# Patient Record
Sex: Female | Born: 1975
Health system: Southern US, Community
[De-identification: ages and names within clinical notes are randomized; demographics above are authoritative.]

## PROBLEM LIST (undated history)

## (undated) DIAGNOSIS — D649 Anemia, unspecified: Secondary | ICD-10-CM

## (undated) DIAGNOSIS — F419 Anxiety disorder, unspecified: Secondary | ICD-10-CM

## (undated) DIAGNOSIS — Z789 Other specified health status: Secondary | ICD-10-CM

## (undated) DIAGNOSIS — R002 Palpitations: Secondary | ICD-10-CM

## (undated) DIAGNOSIS — K219 Gastro-esophageal reflux disease without esophagitis: Secondary | ICD-10-CM

## (undated) HISTORY — DX: Other specified health status: Z78.9

## (undated) HISTORY — DX: Palpitations: R00.2

## (undated) HISTORY — PX: COLONOSCOPY: SHX174

## (undated) HISTORY — DX: Anemia, unspecified: D64.9

## (undated) HISTORY — DX: Anxiety disorder, unspecified: F41.9

## (undated) HISTORY — DX: Gastro-esophageal reflux disease without esophagitis: K21.9

---

## 2001-04-28 ENCOUNTER — Encounter: Payer: Self-pay | Admitting: Emergency Medicine

## 2001-04-28 ENCOUNTER — Emergency Department (HOSPITAL_COMMUNITY): Admission: EM | Admit: 2001-04-28 | Discharge: 2001-04-28 | Payer: Self-pay | Admitting: Emergency Medicine

## 2002-12-31 ENCOUNTER — Emergency Department (HOSPITAL_COMMUNITY): Admission: EM | Admit: 2002-12-31 | Discharge: 2002-12-31 | Payer: Self-pay | Admitting: Emergency Medicine

## 2004-08-26 ENCOUNTER — Inpatient Hospital Stay (HOSPITAL_COMMUNITY): Admission: AD | Admit: 2004-08-26 | Discharge: 2004-08-26 | Payer: Self-pay | Admitting: *Deleted

## 2004-09-23 HISTORY — PX: OTHER SURGICAL HISTORY: SHX169

## 2005-03-29 ENCOUNTER — Inpatient Hospital Stay (HOSPITAL_COMMUNITY): Admission: RE | Admit: 2005-03-29 | Discharge: 2005-04-03 | Payer: Self-pay | Admitting: Obstetrics and Gynecology

## 2005-03-29 ENCOUNTER — Encounter (INDEPENDENT_AMBULATORY_CARE_PROVIDER_SITE_OTHER): Payer: Self-pay | Admitting: Specialist

## 2005-12-01 ENCOUNTER — Emergency Department (HOSPITAL_COMMUNITY): Admission: EM | Admit: 2005-12-01 | Discharge: 2005-12-01 | Payer: Self-pay | Admitting: Family Medicine

## 2005-12-01 ENCOUNTER — Emergency Department (HOSPITAL_COMMUNITY): Admission: EM | Admit: 2005-12-01 | Discharge: 2005-12-01 | Payer: Self-pay | Admitting: Emergency Medicine

## 2007-03-25 ENCOUNTER — Ambulatory Visit: Payer: Self-pay | Admitting: Internal Medicine

## 2007-03-29 ENCOUNTER — Ambulatory Visit (HOSPITAL_COMMUNITY): Admission: RE | Admit: 2007-03-29 | Discharge: 2007-03-29 | Payer: Self-pay | Admitting: Internal Medicine

## 2007-04-10 ENCOUNTER — Ambulatory Visit: Payer: Self-pay | Admitting: Internal Medicine

## 2007-06-19 ENCOUNTER — Emergency Department (HOSPITAL_COMMUNITY): Admission: EM | Admit: 2007-06-19 | Discharge: 2007-06-19 | Payer: Self-pay | Admitting: Emergency Medicine

## 2007-12-07 ENCOUNTER — Telehealth: Payer: Self-pay | Admitting: *Deleted

## 2008-01-18 DIAGNOSIS — K5909 Other constipation: Secondary | ICD-10-CM | POA: Insufficient documentation

## 2008-01-18 DIAGNOSIS — J45909 Unspecified asthma, uncomplicated: Secondary | ICD-10-CM | POA: Insufficient documentation

## 2008-05-17 ENCOUNTER — Ambulatory Visit: Payer: Self-pay | Admitting: Internal Medicine

## 2008-05-17 ENCOUNTER — Encounter: Payer: Self-pay | Admitting: Internal Medicine

## 2008-05-17 DIAGNOSIS — F438 Other reactions to severe stress: Secondary | ICD-10-CM | POA: Insufficient documentation

## 2008-05-17 DIAGNOSIS — R002 Palpitations: Secondary | ICD-10-CM | POA: Insufficient documentation

## 2008-05-17 DIAGNOSIS — F4389 Other reactions to severe stress: Secondary | ICD-10-CM | POA: Insufficient documentation

## 2008-05-18 ENCOUNTER — Telehealth: Payer: Self-pay | Admitting: Licensed Clinical Social Worker

## 2008-05-18 ENCOUNTER — Telehealth: Payer: Self-pay | Admitting: *Deleted

## 2008-06-14 ENCOUNTER — Emergency Department (HOSPITAL_COMMUNITY): Admission: EM | Admit: 2008-06-14 | Discharge: 2008-06-14 | Payer: Self-pay | Admitting: Emergency Medicine

## 2008-07-25 ENCOUNTER — Encounter: Payer: Self-pay | Admitting: Licensed Clinical Social Worker

## 2008-07-25 ENCOUNTER — Ambulatory Visit: Payer: Self-pay | Admitting: Infectious Diseases

## 2008-07-25 DIAGNOSIS — F411 Generalized anxiety disorder: Secondary | ICD-10-CM | POA: Insufficient documentation

## 2008-10-19 ENCOUNTER — Encounter (INDEPENDENT_AMBULATORY_CARE_PROVIDER_SITE_OTHER): Payer: Self-pay | Admitting: *Deleted

## 2008-10-19 ENCOUNTER — Ambulatory Visit: Payer: Self-pay | Admitting: Internal Medicine

## 2008-10-19 ENCOUNTER — Encounter: Payer: Self-pay | Admitting: Internal Medicine

## 2008-10-19 DIAGNOSIS — K921 Melena: Secondary | ICD-10-CM | POA: Insufficient documentation

## 2008-10-19 LAB — CONVERTED CEMR LAB
AST: 15 units/L (ref 0–37)
Calcium: 8.8 mg/dL (ref 8.4–10.5)
Chloride: 105 meq/L (ref 96–112)
Creatinine, Ser: 0.65 mg/dL (ref 0.40–1.20)
HCT: 39.2 % (ref 36.0–46.0)
Hemoglobin: 12.8 g/dL (ref 12.0–15.0)
MCV: 95.6 fL (ref 78.0–100.0)
RBC: 4.09 M/uL (ref 3.87–5.11)
Total Protein: 7.3 g/dL (ref 6.0–8.3)
WBC: 6.3 10*3/uL (ref 4.0–10.5)
aPTT: 33 s (ref 24–37)

## 2008-11-03 ENCOUNTER — Telehealth: Payer: Self-pay | Admitting: *Deleted

## 2009-01-31 ENCOUNTER — Encounter (INDEPENDENT_AMBULATORY_CARE_PROVIDER_SITE_OTHER): Payer: Self-pay | Admitting: *Deleted

## 2009-01-31 ENCOUNTER — Ambulatory Visit: Payer: Self-pay | Admitting: Infectious Disease

## 2009-01-31 LAB — CONVERTED CEMR LAB
Cholesterol, target level: 200 mg/dL
HDL goal, serum: 40 mg/dL
LDL Goal: 160 mg/dL

## 2009-01-31 LAB — HM PAP SMEAR: HM Pap smear: NEGATIVE

## 2009-03-21 ENCOUNTER — Telehealth: Payer: Self-pay | Admitting: *Deleted

## 2009-03-30 DIAGNOSIS — K089 Disorder of teeth and supporting structures, unspecified: Secondary | ICD-10-CM | POA: Insufficient documentation

## 2009-04-19 ENCOUNTER — Telehealth: Payer: Self-pay | Admitting: *Deleted

## 2009-04-20 ENCOUNTER — Encounter (INDEPENDENT_AMBULATORY_CARE_PROVIDER_SITE_OTHER): Payer: Self-pay | Admitting: Internal Medicine

## 2009-04-20 ENCOUNTER — Emergency Department (HOSPITAL_COMMUNITY): Admission: EM | Admit: 2009-04-20 | Discharge: 2009-04-20 | Payer: Self-pay | Admitting: Emergency Medicine

## 2009-04-24 ENCOUNTER — Encounter: Payer: Self-pay | Admitting: Internal Medicine

## 2009-04-24 ENCOUNTER — Telehealth: Payer: Self-pay | Admitting: *Deleted

## 2009-04-24 ENCOUNTER — Ambulatory Visit: Payer: Self-pay | Admitting: Internal Medicine

## 2009-04-24 ENCOUNTER — Encounter (INDEPENDENT_AMBULATORY_CARE_PROVIDER_SITE_OTHER): Payer: Self-pay | Admitting: Internal Medicine

## 2009-04-24 DIAGNOSIS — R079 Chest pain, unspecified: Secondary | ICD-10-CM | POA: Insufficient documentation

## 2009-04-26 ENCOUNTER — Emergency Department (HOSPITAL_COMMUNITY): Admission: EM | Admit: 2009-04-26 | Discharge: 2009-04-26 | Payer: Self-pay | Admitting: Emergency Medicine

## 2009-05-03 ENCOUNTER — Encounter (INDEPENDENT_AMBULATORY_CARE_PROVIDER_SITE_OTHER): Payer: Self-pay | Admitting: Internal Medicine

## 2009-05-03 ENCOUNTER — Ambulatory Visit: Payer: Self-pay | Admitting: Cardiology

## 2009-05-03 ENCOUNTER — Ambulatory Visit (HOSPITAL_COMMUNITY): Admission: RE | Admit: 2009-05-03 | Discharge: 2009-05-03 | Payer: Self-pay | Admitting: Internal Medicine

## 2009-05-26 ENCOUNTER — Ambulatory Visit: Payer: Self-pay | Admitting: Infectious Diseases

## 2009-06-28 ENCOUNTER — Telehealth: Payer: Self-pay | Admitting: Internal Medicine

## 2009-08-15 ENCOUNTER — Ambulatory Visit: Payer: Self-pay | Admitting: Infectious Disease

## 2009-08-23 ENCOUNTER — Emergency Department (HOSPITAL_COMMUNITY): Admission: EM | Admit: 2009-08-23 | Discharge: 2009-08-23 | Payer: Self-pay | Admitting: Family Medicine

## 2009-09-01 ENCOUNTER — Ambulatory Visit: Payer: Self-pay | Admitting: Infectious Diseases

## 2009-09-05 ENCOUNTER — Telehealth: Payer: Self-pay | Admitting: Internal Medicine

## 2009-10-20 ENCOUNTER — Telehealth: Payer: Self-pay | Admitting: Internal Medicine

## 2009-11-27 ENCOUNTER — Encounter: Payer: Self-pay | Admitting: Internal Medicine

## 2009-12-13 ENCOUNTER — Ambulatory Visit: Payer: Self-pay | Admitting: Internal Medicine

## 2009-12-13 DIAGNOSIS — J45901 Unspecified asthma with (acute) exacerbation: Secondary | ICD-10-CM | POA: Insufficient documentation

## 2010-01-03 ENCOUNTER — Telehealth: Payer: Self-pay | Admitting: Internal Medicine

## 2010-01-04 ENCOUNTER — Encounter: Payer: Self-pay | Admitting: Internal Medicine

## 2010-01-04 ENCOUNTER — Ambulatory Visit: Payer: Self-pay | Admitting: Internal Medicine

## 2010-01-04 LAB — CONVERTED CEMR LAB
Hep B Core Total Ab: NEGATIVE
Hep B S Ab: POSITIVE — AB
Hepatitis B Surface Ag: NEGATIVE

## 2010-01-08 ENCOUNTER — Telehealth: Payer: Self-pay | Admitting: Internal Medicine

## 2010-01-19 ENCOUNTER — Telehealth: Payer: Self-pay | Admitting: Licensed Clinical Social Worker

## 2010-02-12 ENCOUNTER — Telehealth (INDEPENDENT_AMBULATORY_CARE_PROVIDER_SITE_OTHER): Payer: Self-pay | Admitting: *Deleted

## 2010-03-27 ENCOUNTER — Telehealth: Payer: Self-pay | Admitting: Internal Medicine

## 2010-05-08 ENCOUNTER — Telehealth: Payer: Self-pay | Admitting: *Deleted

## 2010-06-12 ENCOUNTER — Telehealth: Payer: Self-pay | Admitting: *Deleted

## 2010-07-02 ENCOUNTER — Ambulatory Visit: Payer: Self-pay | Admitting: Internal Medicine

## 2010-07-02 DIAGNOSIS — R070 Pain in throat: Secondary | ICD-10-CM | POA: Insufficient documentation

## 2010-07-02 LAB — CONVERTED CEMR LAB
ALT: 12 units/L (ref 0–35)
Albumin: 4.4 g/dL (ref 3.5–5.2)
Alkaline Phosphatase: 71 units/L (ref 39–117)
BUN: 10 mg/dL (ref 6–23)
Creatinine, Ser: 0.68 mg/dL (ref 0.40–1.20)
Hemoglobin: 12.3 g/dL (ref 12.0–15.0)
MCHC: 31.7 g/dL (ref 30.0–36.0)
Potassium: 4.2 meq/L (ref 3.5–5.3)
RDW: 13.5 % (ref 11.5–15.5)
Sodium: 140 meq/L (ref 135–145)
Total Bilirubin: 0.4 mg/dL (ref 0.3–1.2)

## 2010-07-05 ENCOUNTER — Telehealth: Payer: Self-pay | Admitting: Internal Medicine

## 2010-07-25 ENCOUNTER — Telehealth: Payer: Self-pay | Admitting: Internal Medicine

## 2010-08-01 ENCOUNTER — Telehealth: Payer: Self-pay | Admitting: Internal Medicine

## 2010-09-20 ENCOUNTER — Telehealth (INDEPENDENT_AMBULATORY_CARE_PROVIDER_SITE_OTHER): Payer: Self-pay | Admitting: *Deleted

## 2010-10-23 NOTE — Progress Notes (Signed)
Summary: refill/gg  Phone Note Refill Request  on Feb 12, 2010 1:10 PM  Refills Requested: Medication #1:  ALPRAZOLAM 0.5 MG TABS Take 1 tablet by mouth upto three times a day as needed for anxiety call when ready [phone]   Method Requested: Telephone to Pharmacy Initial call taken by: Merrie Roof RN,  Feb 12, 2010 1:10 PM  Follow-up for Phone Call        Refill approved-nurse to complete Follow-up by: Julaine Fusi  DO,  Feb 12, 2010 2:57 PM  Additional Follow-up for Phone Call Additional follow up Details #1::        Rx called to pharmacy Additional Follow-up by: Merrie Roof RN,  Feb 13, 2010 9:26 AM    Prescriptions: ALPRAZOLAM 0.5 MG TABS (ALPRAZOLAM) Take 1 tablet by mouth upto three times a day as needed for anxiety  #60 x 0   Entered and Authorized by:   Julaine Fusi  DO   Signed by:   Julaine Fusi  DO on 02/12/2010   Method used:   Telephoned to ...       [facility] (retail)       [address] [place], [place]  [address]       Ph: [phone]       Fax: [phone]   RxID:   [id number]

## 2010-10-23 NOTE — Assessment & Plan Note (Signed)
Summary: Hep B

## 2010-10-23 NOTE — Progress Notes (Signed)
Summary: phone/gg  Phone Note Call from Patient   Caller: Patient Summary of Call: Pt called and would like the results of her last lab test.  Please call her at [phone]  Pt called in on 4/19 and would like a referral to therapist for her depression/anxiety.  She states she only has the "orange card", no insurance. Will you do the referral? Pt # [phone] Initial call taken by: Merrie Roof RN,  January 08, 2010 11:53 AM  Follow-up for Phone Call        Called patient back. Patients husband is getting home based hemodialysis and her husband is Hep B positive. As she is taking care of him, she wants to check her Hep B titers. Patient is also requesting a psychotherapy referral. Talked to the lab and the lab says it will only say positive if the titres are more than 10. No need for further testing. Conveyed this to the patient. Follow-up by: Blondell Reveal MD,  January 10, 2010 3:36 PM       Appended Document: phone/gg   Impression & Recommendations:  Problem # 1:  ANXIETY STATE, UNSPECIFIED (ICD-300.00) Patient is requesting for psychotherapy referral. Continue current medications and refer to Psychotherapy.  Her updated medication list for this problem includes:    Alprazolam 0.5 Mg Tabs (Alprazolam) .Marland Kitchen... Take 1 tablet by mouth upto three times a day as needed for anxiety    Citalopram Hydrobromide 20 Mg Tabs (Citalopram hydrobromide) .Marland Kitchen... Take 1 tablet by mouth once a day  Orders: Psychology Referral (Psychology)   Complete Medication List: 1)  Ventolin Hfa 108 (90 Base) Mcg/act Aers (Albuterol sulfate) .Marland Kitchen.. 1-2 puffs as needed 2)  Advair Diskus 250-50 Mcg/dose Aepb (Fluticasone-salmeterol) .Marland Kitchen.. 1 puff twice daily. 3)  Alprazolam 0.5 Mg Tabs (Alprazolam) .... Take 1 tablet by mouth upto three times a day as needed for anxiety 4)  Citalopram Hydrobromide 20 Mg Tabs (Citalopram hydrobromide) .... Take 1 tablet by mouth once a day 5)  Doxycycline Hyclate 100 Mg Tabs  (Doxycycline hyclate) .... Take 1 tablet by mouth two times a day 6)  Prednisone 20 Mg Tabs (Prednisone) .... Take 3 tabs by mouth daily for two days, then take 2 tabs by mouth for 2 days, then 1 tab by mouth for 3 days.

## 2010-10-23 NOTE — Progress Notes (Signed)
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   Nurse there told her that she would need to get injections for the Hepatitis.  Will she need to see the physician or just have the Titers done. Initial call taken by: Angelina Ok RN,  January 03, 2010 3:07 PM  Follow-up for Phone Call        Will do Hepatits B titers and will follow up. Follow-up by: Blondell Reveal MD,  January 03, 2010 3:16 PM

## 2010-10-23 NOTE — Progress Notes (Signed)
Summary: refill/ hla  Phone Note Refill Request Message from:  Patient on June 12, 2010 8:39 AM  Refills Requested: Medication #1:  ALPRAZOLAM 0.25 MG TABS Take 1-2 tablet by mouth upto three times a day as needed for anxiety   Dosage confirmed as above?Dosage Confirmed   Last Refilled: 8/16 last visit april, next visit 9/26  Initial call taken by: Marin Roberts RN,  June 12, 2010 8:39 AM  Follow-up for Phone Call        Rx called to pharmacy Follow-up by: Marin Roberts RN,  June 13, 2010 10:40 AM    Prescriptions: ALPRAZOLAM 0.25 MG TABS (ALPRAZOLAM) Take 1-2 tablet by mouth upto three times a day as needed for anxiety  #100 x 0   Entered and Authorized by:   Zoila Shutter MD   Signed by:   Zoila Shutter MD on 06/12/2010   Method used:   Telephoned to ...       [facility]* (retail)       [address]       [address] Shipping/mailing       [place], [place]  [address]       Ph: [phone]       Fax: [phone]   RxID:   [id number]

## 2010-10-23 NOTE — Progress Notes (Signed)
Summary: refill/gg  Phone Note Refill Request  on October 20, 2009 11:26 AM  Refills Requested: Medication #1:  ALPRAZOLAM 0.5 MG TABS Take 1 tablet by mouth upto three times a day as needed for anxiety   Last Refilled: 09/01/2009  Method Requested: Telephone to Pharmacy Initial call taken by: Merrie Roof RN,  October 20, 2009 11:26 AM  Follow-up for Phone Call        Reviewed last [facility] note.  Celexa importance stressed at that time.  Benzo continued.  Wil  refill Follow-up by: Blanch Media MD,  October 20, 2009 4:05 PM    Prescriptions: ALPRAZOLAM 0.5 MG TABS (ALPRAZOLAM) Take 1 tablet by mouth upto three times a day as needed for anxiety  #60 x 0   Entered and Authorized by:   Blanch Media MD   Signed by:   Blanch Media MD on 10/20/2009   Method used:   Print then Give to Patient   RxID:   [id number]   Appended Document: refill/gg Rx called into [facility]

## 2010-10-23 NOTE — Progress Notes (Signed)
Summary: Appointment ENT  Phone Note Outgoing Call   Call placed by: Angelina Ok RN,  August 01, 2010 2:40 PM Call placed to: Patient Summary of Call: RTC call to pt would like to be scheduled with an ENT.  Pt saod that she understands that she will need to pay for the visits.  Pt asked if the Clinics could do something with out her going to the ENT or could she go to Urgent Care and possibly have an ENT see her there.  Explained to pt that shwe woild probably still have to pay for the ENT and would also be charged for the Urgent/ER visit as well.  Call to [facility] ENT talked with the scheduler there who said that pt's cost would be less if she sees the PA.  Cost would be for the initial visit at $100.00.  Appointment was made with  [place], [place] 08/06/2010 1:30 PM.  Attempts to call pt message left to call the Clinics for the ENT appointment.Angelina Ok RN  August 01, 2010 2:47 PM  Initial call taken by: Angelina Ok RN,  August 01, 2010 2:44 PM

## 2010-10-23 NOTE — Assessment & Plan Note (Signed)
Summary: THROAT/SB.   Vital Signs:  Patient profile:   35 year old female Height:      67 inches (170.18 cm) Weight:      161.03 pounds (73.20 kg) BMI:     25.31 Temp:     97.8 degrees F (36.56 degrees C) oral Pulse rate:   74 / minute BP sitting:   109 / 68  (right arm)  Vitals Entered By: Angelina Ok RN (July 02, 2010 9:49 AM) Is Patient Diabetic? No Pain Assessment Patient in pain? no      Nutritional Status BMI of 25 - 29 = overweight  Have you ever been in a relationship where you felt threatened, hurt or afraid?No   Does patient need assistance? Functional Status Self care Ambulation Normal Comments Wants to get a flu shot. Scratchy throat.  No fevers.  No chills.  Needs refills of meds sent to [facility].   Primary Care Provider:  Blondell Reveal MD   History of Present Illness: 35 yr old woman with pmhx as described below comes to the clinic complaining throat pain for the last 6 months. Reports that she has felt pumps in the back of her troat. Reports to have increased saliva. When patient swallow reports that her throat feels scratchy. Denies coughing, dysphagia, fever, chills, vomiting.  Depression History:      The patient denies a depressed mood most of the day and a diminished interest in her usual daily activities.         Preventive Screening-Counseling & Management  Alcohol-Tobacco     Alcohol type: occasionally     Smoking Status: quit     Smoking Cessation Counseling: yes     Packs/Day: <0.25     Year Started: smoke 1-2 times a day, 3-4 per week  Comments: OCASSIONALLY 2 TO 3 PER WEEK.  Problems Prior to Update: 1)  Hepatitis B Immunity  (ICD-V05.8) 2)  Asthma, With Acute Exacerbation  (ICD-493.92) 3)  Chest Pain  (ICD-786.50) 4)  Dental Pain  (ICD-525.9) 5)  Preventive Health Care  (ICD-V70.0) 6)  Blood in Stool  (ICD-578.1) 7)  Anxiety State, Unspecified  (ICD-300.00) 8)  Anxiety, Situational  (ICD-308.3) 9)  Palpitations, Chronic   (ICD-785.1) 10)  Asthma  (ICD-493.90) 11)  Constipation, Chronic  (ICD-564.09)  Medications Prior to Update: 1)  Ventolin Hfa 108 (90 Base) Mcg/act Aers (Albuterol Sulfate) .Marland Kitchen.. 1-2 Puffs As Needed 2)  Advair Diskus 250-50 Mcg/dose Aepb (Fluticasone-Salmeterol) .Marland Kitchen.. 1 Puff Twice Daily. 3)  Alprazolam 0.25 Mg Tabs (Alprazolam) .... Take 1-2 Tablet By Mouth Upto Three Times A Day As Needed For Anxiety 4)  Citalopram Hydrobromide 20 Mg Tabs (Citalopram Hydrobromide) .... Take 1 Tablet By Mouth Once A Day 5)  Doxycycline Hyclate 100 Mg Tabs (Doxycycline Hyclate) .... Take 1 Tablet By Mouth Two Times A Day 6)  Prednisone 20 Mg Tabs (Prednisone) .... Take 3 Tabs By Mouth Daily For Two Days, Then Take 2 Tabs By Mouth For 2 Days, Then 1 Tab By Mouth For 3 Days.  Current Medications (verified): 1)  Ventolin Hfa 108 (90 Base) Mcg/act Aers (Albuterol Sulfate) .Marland Kitchen.. 1-2 Puffs As Needed 2)  Advair Diskus 250-50 Mcg/dose Aepb (Fluticasone-Salmeterol) .Marland Kitchen.. 1 Puff Twice Daily. 3)  Alprazolam 0.25 Mg Tabs (Alprazolam) .... Take 1-2 Tablet By Mouth Upto Three Times A Day As Needed For Anxiety  Allergies: No Known Drug Allergies  Past History:  Past Medical History: Last updated: 01/31/2009 Asthma Constipation : Colonoscopy normal (04/10/2007), [place] Dr. Verlee Monte  [place] Anxiety  Past Surgical History: Last updated: 01/18/2008 TUBAL LIGATION-2006  Family History: Last updated: 01/31/2009 Maternal GM: BRCA M: 62, healthy D:unknown health hx.  Social History: Last updated: 01/31/2009 smokes 1-2 cig per week drinks only socially no drugs Lives with husband who is very ill.  Risk Factors: Exercise: no (12/13/2009)  Risk Factors: Smoking Status: quit (07/02/2010) Packs/Day: <0.25 (07/02/2010)  Family History: Reviewed history from 01/31/2009 and no changes required. Maternal GM: BRCA M: 62, healthy D:unknown health hx.  Social History: Reviewed history from 01/31/2009 and no changes  required. smokes 1-2 cig per week drinks only socially no drugs Lives with husband who is very ill.  Review of Systems  The patient denies fever, chest pain, dyspnea on exertion, peripheral edema, hemoptysis, abdominal pain, melena, hematochezia, hematuria, muscle weakness, and difficulty walking.    Physical Exam  General:  NAD Mouth:  pharynx pink and moist.  no erythema, Large tonsils noted, no exudates Neck:  supple.   Lungs:  Occasional rhonchi while coughing, no crackles, no wheezing, nl resp effort, good air mvt. Heart:  Normal rate and regular rhythm. S1 and S2 normal without gallop, murmur, click, rub or other extra sounds. Abdomen:  +BS's, soft, NT and ND.  Msk:  normal ROM.   Extremities:  no edema.  Neurologic:  gait normal.   Impression & Recommendations:  Problem # 1:  THROAT PAIN, CHRONIC (ICD-784.1) May be related to large tonsils. No evidence of infection. Will referr to ENT and follow up.  Orders: ENT Referral (ENT)  Problem # 2:  ASTHMA (ICD-493.90) Stable. Continue current regimen.  The following medications were removed from the medication list:    Prednisone 20 Mg Tabs (Prednisone) .Marland Kitchen... Take 3 tabs by mouth daily for two days, then take 2 tabs by mouth for 2 days, then 1 tab by mouth for 3 days. Her updated medication list for this problem includes:    Ventolin Hfa 108 (90 Base) Mcg/act Aers (Albuterol sulfate) .Marland Kitchen... 1-2 puffs as needed    Advair Diskus 250-50 Mcg/dose Aepb (Fluticasone-salmeterol) .Marland Kitchen... 1 puff twice daily.  Problem # 3:  PREVENTIVE HEALTH CARE (ICD-V70.0) Review labs and reasses.  Orders: T-CMP with Estimated GFR ([id number]) T-TSH ([id number]) T-CBC No Diff ([id number])  Complete Medication List: 1)  Ventolin Hfa 108 (90 Base) Mcg/act Aers (Albuterol sulfate) .Marland Kitchen.. 1-2 puffs as needed 2)  Advair Diskus 250-50 Mcg/dose Aepb (Fluticasone-salmeterol) .Marland Kitchen.. 1 puff twice daily. 3)  Alprazolam 0.25 Mg Tabs (Alprazolam) .... Take  1-2 tablet by mouth upto three times a day as needed for anxiety  Other Orders: Influenza Vaccine NON MCR ([id number])  Patient Instructions: 1)  Please schedule a follow-up appointment in 6 months. 2)  Take all medication as directed. 3)  You will be called with any abnormalities in the tests scheduled or performed today.  If you don't hear from [place] within a week from when the test was performed, you can assume that your test was normal.  Prescriptions: ADVAIR DISKUS 250-50 MCG/DOSE AEPB (FLUTICASONE-SALMETEROL) 1 puff twice daily.  #1 x 11   Entered and Authorized by:   Laren Everts MD   Signed by:   Laren Everts MD on 07/02/2010   Method used:   Faxed to ...       [facility] (retail)       [address] [place], [place]  [address]       Ph: [phone]  Fax: [phone]   RxID:   [id number] VENTOLIN HFA 108 (90 BASE) MCG/ACT AERS (ALBUTEROL SULFATE) 1-2 puffs as needed  #1 x 11   Entered and Authorized by:   Laren Everts MD   Signed by:   Laren Everts MD on 07/02/2010   Method used:   Faxed to ...       [facility] (retail)       [address] [place], [place]  [address]       Ph: [phone]       Fax: [phone]   RxID:   [id number]   Prevention & Chronic Care Immunizations   Influenza vaccine: Fluvax Non-MCR  (07/02/2010)    Tetanus booster: Not documented   Td booster deferral: Deferred  (12/13/2009)    Pneumococcal vaccine: Not documented  Other Screening   Pap smear:  Specimen Adequacy: Satisfactory for evaluation.   Interpretation/Result:Negative for intraepithelial Lesion or Malignancy.     (01/31/2009)   Pap smear due: 01/2010   Smoking status: quit  (07/02/2010)  Lipids   Total Cholesterol: Not documented   Lipid panel action/deferral: Deferred   LDL: Not documented   LDL Direct: Not documented   HDL: Not documented   Triglycerides: Not  documented    Vital Signs:  Patient profile:   35 year old female Height:      67 inches (170.18 cm) Weight:      161.03 pounds (73.20 kg) BMI:     25.31 Temp:     97.8 degrees F (36.56 degrees C) oral Pulse rate:   74 / minute BP sitting:   109 / 68  (right arm)  Vitals Entered By: Angelina Ok RN (July 02, 2010 9:49 AM)   Process Orders Check Orders Results:     [facility]: ABN not required for this insurance Tests Sent for requisitioning (July 02, 2010 10:58 AM):     07/02/2010: [facility] -- T-CMP with Estimated GFR [[id number]] (signed)     07/02/2010: [facility] -- T-TSH [id number] (signed)     07/02/2010: [facility] -- T-CBC No Diff [[id number]] (signed)      Immunizations Administered:  Influenza Vaccine # 1:    Vaccine Type: Fluvax Non-MCR    Site: left deltoid    Mfr: GlaxoSmithKline    Dose: 0.5 ml    Route: IM    Given by: Angelina Ok RN    Exp. Date: 03/23/2011    Lot #: [id number]    VIS given: 04/17/10 version given July 02, 2010.  Flu Vaccine Consent Questions:    Do you have a history of severe allergic reactions to this vaccine? no    Any prior history of allergic reactions to egg and/or gelatin? no    Do you have a sensitivity to the preservative Thimersol? no    Do you have a past history of Guillan-Barre Syndrome? no    Do you currently have an acute febrile illness? no    Have you ever had a severe reaction to latex? no    Vaccine information given and explained to patient? yes    Are you currently pregnant? no

## 2010-10-23 NOTE — Progress Notes (Signed)
Summary: refill/ hla  Phone Note Refill Request Message from:  Patient on March 27, 2010 8:48 AM  Refills Requested: Medication #1:  ALPRAZOLAM 0.5 MG TABS Take 1 tablet by mouth upto three times a day as needed for anxiety   Dosage confirmed as above?Dosage Confirmed   Last Refilled: 5/23 they carry 0.25 mg so can you order that and take #2?  Initial call taken by: Marin Roberts RN,  March 27, 2010 8:52 AM  Follow-up for Phone Call        Will change it to 0.25 mg. Follow-up by: Blondell Reveal MD,  March 29, 2010 12:20 PM    New/Updated Medications: ALPRAZOLAM 0.25 MG TABS (ALPRAZOLAM) Take 1-2 tablet by mouth upto three times a day as needed for anxiety Prescriptions: ALPRAZOLAM 0.25 MG TABS (ALPRAZOLAM) Take 1-2 tablet by mouth upto three times a day as needed for anxiety  #60 x 0   Entered and Authorized by:   Blondell Reveal MD   Signed by:   Blondell Reveal MD on 03/29/2010   Method used:   Print then Give to Patient   RxID:   [id number]   Appended Document: refill/ hla Prescriptions: ALPRAZOLAM 0.25 MG TABS (ALPRAZOLAM) Take 1-2 tablet by mouth upto three times a day as needed for anxiety  #120 x 0   Entered and Authorized by:   Blondell Reveal MD   Signed by:   Merrie Roof RN on 03/29/2010   Method used:   Telephoned to ...       [facility] ([facility]ail[facility]       [address]       [place], [place]  [address]       Ph: [phone]       Fax: [phone]   RxID:   [id number]  Rx called into [facility] Merrie Roof RN  March 29, 2010 2:02 PM

## 2010-10-23 NOTE — Progress Notes (Signed)
Summary: refill/ hla  Phone Note Refill Request Message from:  Patient on May 08, 2010 11:05 AM  Refills Requested: Medication #1:  ALPRAZOLAM 0.25 MG TABS Take 1-2 tablet by mouth upto three times a day as needed for anxiety   Dosage confirmed as above?Dosage Confirmed   Supply Requested: 1 month   Last Refilled: 7/5 Initial call taken by: Marin Roberts RN,  May 08, 2010 11:06 AM  Follow-up for Phone Call        I am only prescribing #100 rather than #120 as I suspect she is trying to take the lower dose (0.25 mg) at times rather than the previous whole dose of 0.5 mg with her recent request for the lower dose tablet.  Hence, she likely does not need all 120 tablets, otherwise there would be no reason to lower the dose and double thenumber of tablets. Follow-up by: Doneen Poisson MD,  May 10, 2010 2:02 PM  Additional Follow-up for Phone Call Additional follow up Details #1::        Rx called to pharmacy.  Attempts to call pt to inform her that her prescription has been called in.  No answer. Additional Follow-up by: Angelina Ok RN,  May 10, 2010 2:12 PM    Prescriptions: ALPRAZOLAM 0.25 MG TABS (ALPRAZOLAM) Take 1-2 tablet by mouth upto three times a day as needed for anxiety  #100 x 0   Entered and Authorized by:   Doneen Poisson MD   Signed by:   Doneen Poisson MD on 05/10/2010   Method used:   Telephoned to ...       [facility]* (retail)       [address]       [address] Shipping/mailing       [place], [place]  [address]       Ph: [phone]       Fax: [phone]   RxID:   [id number]

## 2010-10-23 NOTE — Progress Notes (Signed)
Summary: Soc. Work  Programme researcher, broadcasting/film/video of Call: Called patient and gave her information for [facility] for psychotherapy.   Told her that they will likely be able to serve her sooner than [facility].   As a back up,  I have also given the patient information about [facility].   SW follow-up to ensure appointment.

## 2010-10-23 NOTE — Progress Notes (Signed)
Summary: refill/gg  Phone Note Refill Request  on July 25, 2010 8:39 AM  Refills Requested: Medication #1:  ALPRAZOLAM 0.25 MG TABS Take 1-2 tablet by mouth upto three times a day as needed for anxiety.   Last Refilled: 06/12/2010  Method Requested: Fax to Local Pharmacy Initial call taken by: Merrie Roof RN,  July 25, 2010 8:39 AM  Follow-up for Phone Call        Refill approved-nurse to complete Follow-up by: Blondell Reveal MD,  July 27, 2010 11:36 AM  Additional Follow-up for Phone Call Additional follow up Details #1::        Rx called to pharmacy Additional Follow-up by: Angelina Ok RN,  July 27, 2010 3:16 PM    .Prescriptions: ALPRAZOLAM 0.25 MG TABS (ALPRAZOLAM) Take 1-2 tablet by mouth upto three times a day as needed for anxiety  #100 x 0   Entered and Authorized by:   Blondell Reveal MD   Signed by:   Angelina Ok RN on 07/27/2010   Method used:   Telephoned to ...       [facility]* (retail)       [address]       [address] Shipping/mailing       [place], [place]  [address]       Ph: [phone]       Fax: [phone]   RxID:   [id number]

## 2010-10-23 NOTE — Miscellaneous (Signed)
Summary: MEDICATION CONTRACT  MEDICATION CONTRACT   Imported By: Margie Billet 12/14/2009 10:42:29  _____________________________________________________________________  External Attachment:    Type:   Image     Comment:   External Document

## 2010-10-23 NOTE — Progress Notes (Signed)
Summary: ENT Appointment  Phone Note Outgoing Call   Call placed by: Angelina Ok RN,  July 05, 2010 11:24 AM Call placed to: Patient Summary of Call: Call to pt to inform her that [facility] was unable to schedule her for an appointment here in [place].  If pt gets a referral she will need to work out a payment with the particular doctor's office.   Call to [facility].  Pt was given an appointment in the ENT Clinics there for 07/11/2010 at 3:30 PM.  Pt said taht she would not be able to go to [place].  Problems with transportation.  Pt was also informed that she would need to speak with the Financial Counselor there prior to going for an appointment.  Pt was given the number to contact the [employer].  Pt said that she will call back if she decides to go to the ENT.   Call to the [facility] ENT at [phone] to cancel the appointment. Initial call taken by: Angelina Ok RN,  July 05, 2010 11:29 AM    Noted.

## 2010-10-23 NOTE — Assessment & Plan Note (Signed)
Summary: ACUTE-COLD SX/CFB(BOGGALA)   Vital Signs:  Patient profile:   35 year old female Height:      67 inches (170.18 cm) Weight:      163.3 pounds (74.23 kg) BMI:     25.67 O2 Sat:      97 % on Room air Temp:     98.2 degrees F (36.78 degrees C) oral Pulse rate:   80 / minute BP sitting:   105 / 73  (left arm)  Vitals Entered By: Stanton Kidney Ditzler RN (December 13, 2009 10:43 AM)  O2 Flow:  Room air Is Patient Diabetic? No Pain Assessment Patient in pain? no      Nutritional Status BMI of 25 - 29 = overweight Nutritional Status Detail appetite good  Have you ever been in a relationship where you felt threatened, hurt or afraid?denies   Does patient need assistance? Functional Status Self care Ambulation Normal Comments Past 3 weeks yellow productive cough - occ hurts with deep breath. ? head congestion Husband with pt.   Primary Care Provider:  Blondell Reveal MD   History of Present Illness: Pt is a 34 yo female w/ past med hx below here for evaluation of chest congestion and a cold x 3 weeks.  She had subjective fevers for a couple of days about 2-3 days ago.  She has been coughing up yellow phlegm.  She is now having some mild SOB, which she wasn't having 3 weks ago.  She has been using her albuterol inhaler more than usual.  She denies chest pain, rhinorrhea, pharyngitis, and watery eyes.  She has tried mucinex and it helped some.  Everyone in the house has been sick over the last few weeks.   Of note, she feels like her anxiety symptoms are improving with the xanax and increase SSRI dose.  Depression History:      The patient denies a depressed mood most of the day and a diminished interest in her usual daily activities.         Preventive Screening-Counseling & Management  Alcohol-Tobacco     Alcohol type: occasionally     Smoking Status: quit     Smoking Cessation Counseling: yes     Packs/Day: <0.25     Year Started: smoke 1-2 times a day, 3-4 per  week  Caffeine-Diet-Exercise     Does Patient Exercise: no  Current Medications (verified): 1)  Ventolin Hfa 108 (90 Base) Mcg/act Aers (Albuterol Sulfate) .Marland Kitchen.. 1-2 Puffs As Needed 2)  Advair Diskus 250-50 Mcg/dose Aepb (Fluticasone-Salmeterol) .Marland Kitchen.. 1 Puff Twice Daily. 3)  Alprazolam 0.5 Mg Tabs (Alprazolam) .... Take 1 Tablet By Mouth Upto Three Times A Day As Needed For Anxiety 4)  Citalopram Hydrobromide 20 Mg Tabs (Citalopram Hydrobromide) .... Take 1 Tablet By Mouth Once A Day  Allergies (verified): No Known Drug Allergies  Past History:  Past Medical History: Last updated: 01/31/2009 Asthma Constipation : Colonoscopy normal (04/10/2007), [facility] Dr. Julio Alm Anxiety  Past Surgical History: Last updated: 01/18/2008 TUBAL LIGATION-2006  Social History: Last updated: 01/31/2009 smokes 1-2 cig per week drinks only socially no drugs Lives with husband who is very ill.  Social History: Reviewed history from 01/31/2009 and no changes required. smokes 1-2 cig per week drinks only socially no drugs Lives with husband who is very ill.  Review of Systems       as per HPI.   Physical Exam  General:  alert, appropriately groomed, no distress.  Eyes:  anicteric, PERRL Ears:  TM's nl bilaterally, mild cerumen in R canal. Mouth:  MMM, no exudates or erythema. Neck:  no LAD. Lungs:  Occasional rhonchi while coughing, no crackles, no wheezing, nl resp effort, good air mvt. Heart:  Normal rate and regular rhythm. S1 and S2 normal without gallop, murmur, click, rub or other extra sounds. Abdomen:  +BS's, soft, NT and ND.  Extremities:  no edema.  Neurologic:  gait normal. Psych:  mood euthymic, not anxious appearing   Impression & Recommendations:  Problem # 1:  ASTHMA, WITH ACUTE EXACERBATION (ICD-493.92) Symptoms c/w asthma exac/bronchitis vs pna.  Given subjective fevers, will give course of doxy.  She has had her tubes tied so not concerned about pregnancy.   Will also give steroid taper given asthma.  Fortunately, she is comfortable on exam and has no wheezing, so do not think she needs a nebulizer tx now.  I have asked her to f/u if she is not better in 1-2 weeks.  Her updated medication list for this problem includes:    Ventolin Hfa 108 (90 Base) Mcg/act Aers (Albuterol sulfate) .Marland Kitchen... 1-2 puffs as needed    Advair Diskus 250-50 Mcg/dose Aepb (Fluticasone-salmeterol) .Marland Kitchen... 1 puff twice daily.    Prednisone 20 Mg Tabs (Prednisone) .Marland Kitchen... Take 3 tabs by mouth daily for two days, then take 2 tabs by mouth for 2 days, then 1 tab by mouth for 3 days.  Problem # 2:  ANXIETY STATE, UNSPECIFIED (ICD-300.00) Pt states symptoms are much better w/ celexa and alprazolam.  Since the xanax may be a longer term medication for her, I went over the controlled substances policy with her in great detail and she verbalized understanding and signed the contract.    Her updated medication list for this problem includes:    Alprazolam 0.5 Mg Tabs (Alprazolam) .Marland Kitchen... Take 1 tablet by mouth upto three times a day as needed for anxiety    Citalopram Hydrobromide 20 Mg Tabs (Citalopram hydrobromide) .Marland Kitchen... Take 1 tablet by mouth once a day  Complete Medication List: 1)  Ventolin Hfa 108 (90 Base) Mcg/act Aers (Albuterol sulfate) .Marland Kitchen.. 1-2 puffs as needed 2)  Advair Diskus 250-50 Mcg/dose Aepb (Fluticasone-salmeterol) .Marland Kitchen.. 1 puff twice daily. 3)  Alprazolam 0.5 Mg Tabs (Alprazolam) .... Take 1 tablet by mouth upto three times a day as needed for anxiety 4)  Citalopram Hydrobromide 20 Mg Tabs (Citalopram hydrobromide) .... Take 1 tablet by mouth once a day 5)  Doxycycline Hyclate 100 Mg Tabs (Doxycycline hyclate) .... Take 1 tablet by mouth two times a day 6)  Prednisone 20 Mg Tabs (Prednisone) .... Take 3 tabs by mouth daily for two days, then take 2 tabs by mouth for 2 days, then 1 tab by mouth for 3 days.  Patient Instructions: 1)  Please make a followup appointment in 3  months for a checkup. 2)  Call sooner if you do not get better. Prescriptions: CITALOPRAM HYDROBROMIDE 20 MG TABS (CITALOPRAM HYDROBROMIDE) Take 1 tablet by mouth once a day  #30 x 3   Entered and Authorized by:   Joaquin Courts  MD   Signed by:   Joaquin Courts  MD on 12/13/2009   Method used:   Print then Give to Patient   RxID:   [id number] ALPRAZOLAM 0.5 MG TABS (ALPRAZOLAM) Take 1 tablet by mouth upto three times a day as needed for anxiety  #60 x 0   Entered and Authorized by:   Joaquin Courts  MD   Signed by:  Joaquin Courts  MD on 12/13/2009   Method used:   Print then Give to Patient   RxID:   [id number] PREDNISONE 20 MG TABS (PREDNISONE) Take 3 tabs by mouth daily for two days, then take 2 tabs by mouth for 2 days, then 1 tab by mouth for 3 days.  #13 x 0   Entered and Authorized by:   Joaquin Courts  MD   Signed by:   Joaquin Courts  MD on 12/13/2009   Method used:   Print then Give to Patient   RxID:   [id number] DOXYCYCLINE HYCLATE 100 MG TABS (DOXYCYCLINE HYCLATE) Take 1 tablet by mouth two times a day  #20 x 0   Entered and Authorized by:   Joaquin Courts  MD   Signed by:   Joaquin Courts  MD on 12/13/2009   Method used:   Print then Give to Patient   RxID:   [id number]    Prevention & Chronic Care Immunizations   Influenza vaccine: Fluvax Non-MCR  (08/15/2009)    Tetanus booster: Not documented   Td booster deferral: Deferred  (12/13/2009)    Pneumococcal vaccine: Not documented  Other Screening   Pap smear:  Specimen Adequacy: Satisfactory for evaluation.   Interpretation/Result:Negative for intraepithelial Lesion or Malignancy.     (01/31/2009)   Pap smear due: 01/2010   Smoking status: quit  (12/13/2009)  Lipids   Total Cholesterol: Not documented   Lipid panel action/deferral: Deferred   LDL: Not documented   LDL Direct: Not documented   HDL: Not documented   Triglycerides: Not documented      Resource handout printed.

## 2010-10-25 NOTE — Progress Notes (Signed)
Summary: Refill/gh  Phone Note Refill Request Message from:  Fax from Pharmacy on September 20, 2010 11:11 AM  Refills Requested: Medication #1:  ALPRAZOLAM 0.25 MG TABS Take 1-2 tablet by mouth upto three times a day as needed for anxiety.   Last Refilled: 07/27/2010 Last labs and office visit was 07/02/2010   Method Requested: Fax to Local Pharmacy Initial call taken by: Angelina Ok RN,  September 20, 2010 11:11 AM  Follow-up for Phone Call        Refill approved-nurse to complete Follow-up by: Julaine Fusi  DO,  September 20, 2010 1:53 PM  Additional Follow-up for Phone Call Additional follow up Details #1::        Rx called to pharmacy Additional Follow-up by: Angelina Ok RN,  September 20, 2010 2:44 PM    Prescriptions: ALPRAZOLAM 0.25 MG TABS (ALPRAZOLAM) Take 1-2 tablet by mouth upto three times a day as needed for anxiety  #100 x 0   Entered and Authorized by:   Julaine Fusi  DO   Signed by:   Julaine Fusi  DO on 09/20/2010   Method used:   Telephoned to ...       [facility]* (retail)       [address]       [address] Shipping/mailing       [place], [place]  [address]       Ph: [phone]       Fax: [phone]   RxID:   [id number]

## 2010-12-03 ENCOUNTER — Other Ambulatory Visit: Payer: Self-pay | Admitting: Internal Medicine

## 2010-12-26 ENCOUNTER — Encounter: Payer: Self-pay | Admitting: Vascular Surgery

## 2010-12-26 ENCOUNTER — Ambulatory Visit: Payer: Self-pay | Admitting: Internal Medicine

## 2010-12-29 LAB — BASIC METABOLIC PANEL
BUN: 9 mg/dL (ref 6–23)
Calcium: 9.4 mg/dL (ref 8.4–10.5)
Chloride: 110 mEq/L (ref 96–112)
Creatinine, Ser: 0.64 mg/dL (ref 0.4–1.2)
GFR calc non Af Amer: >60 mL/min (ref >60)
Glucose, Bld: 89 mg/dL (ref 70–99)
Sodium: 138 mEq/L (ref 135–145)

## 2010-12-29 LAB — DIFFERENTIAL
Basophils Relative: 1 % (ref 0–1)
Eosinophils Absolute: 0 10*3/uL (ref 0.0–0.7)
Monocytes Relative: 5 % (ref 3–12)
Neutrophils Relative %: 56 % (ref 43–77)

## 2010-12-29 LAB — CBC
HCT: 38.3 % (ref 36.0–46.0)
Hemoglobin: 12.9 g/dL (ref 12.0–15.0)
MCHC: 33.8 g/dL (ref 30.0–36.0)
MCV: 96.1 fL (ref 78.0–100.0)
RDW: 12.8 % (ref 11.5–15.5)
WBC: 7.6 10*3/uL (ref 4.0–10.5)

## 2010-12-30 LAB — DIFFERENTIAL
Eosinophils Absolute: 0.1 10*3/uL (ref 0.0–0.7)
Eosinophils Relative: 1 % (ref 0–5)
Lymphocytes Relative: 35 % (ref 12–46)
Monocytes Absolute: 0.4 10*3/uL (ref 0.1–1.0)
Monocytes Relative: 7 % (ref 3–12)
Neutro Abs: 3 10*3/uL (ref 1.7–7.7)

## 2010-12-30 LAB — BASIC METABOLIC PANEL
CO2: 20 mEq/L (ref 19–32)
Calcium: 9.7 mg/dL (ref 8.4–10.5)
GFR calc non Af Amer: >60 mL/min (ref >60)
Glucose, Bld: 102 mg/dL — ABNORMAL HIGH (ref 70–99)
Potassium: 3.9 mEq/L (ref 3.5–5.1)
Sodium: 139 mEq/L (ref 135–145)

## 2010-12-30 LAB — CBC
HCT: 37.8 % (ref 36.0–46.0)
MCV: 95.1 fL (ref 78.0–100.0)

## 2011-01-08 ENCOUNTER — Encounter: Payer: Self-pay | Admitting: Ophthalmology

## 2011-02-05 NOTE — Assessment & Plan Note (Signed)
HEALTHCARE                         GASTROENTEROLOGY OFFICE NOTE   Megan, Richardson Megan BOJARSKI                     MRN:          [id number]  DATE:03/25/2007                            DOB:          [birth year]    [name] is a very nice 35 year old mother of 2, Dr. Lovell Sheehan patient  who is here for consultation with regards to her chronic constipation.  [name] says that she has been constipated since age 75.  She did not  know what it was, but she ended up in emergency room at least 3 or 4  occasions with abdominal pain. Only later on she was diagnosed with  constipation and started on laxatives.  The constipation has continued  through her pregnancy and since the casserian section several years ago.  As a result of the constipation she has also developed bloating and  increased gastroesophageal reflux for which she was evaluated in  [place] in the [facility] with upper endoscopy about 6 or 7  years ago.  She was found to have a gastritis.  Xoey tries to eat a  high fiber diet, although she does not eat breakfast.  She has good  lunch and supper.  Her weight has been up about 30 pounds over her usual  prepregnancy weight.  She has a family history of constipation in her  maternal grandmother as well as her older sister.  She denies any  diarrhea unless she takes excess prune juice.  She has gotten used to  taking prune juice about every 3 days, about 8 ounces.  This seems to  work quite well for her.  She failed the Reglan, MiraLax and Zelnorm.  Without laxatives her bowel movements would not occur for 8 or 9 days.  She denies rectal bleeding. She has never had any abdominal surgery  other than C-sections.  Her constipation seems to better on the first of  her period, but it is worse again for the rest of the month.  She is  reasonably active having 2 small children and staying at home.   MEDICATIONS:  1. Advair b.i.d.  2. Ranitidine with  meals.  3. Reglan with meals.   PAST HISTORY:  Significant for mild hydronephrosis following her C-  section.  Tubal ligation 2006.  Asthmatic bronchitis.   FAMILY HISTORY:  Positive for constipation, negative for colon cancer,  positive for breast cancer in grandmother.   SOCIAL HISTORY:  Married with 2 children.  High school education.  She  smokes occasionally and drinks alcohol occasionally.   REVIEW OF SYSTEMS:  Positive for pain with periods.   PHYSICAL EXAMINATION:  Blood pressure 118/62, pulse 68 and weight 174.4  pounds.  She was alert, oriented and in no distress.  Her thyroid was normal.  Sclerae is nonicteric.  LUNGS:  Clear to auscultation.  COR:  With normal S1, normal S2.  ABDOMEN:  Soft with decreased muscle tone.  It was very relaxed with  normoactive bowel sounds and mild tenderness in epigastrium in the  midline, above the umbilicus.  There were no pulsations, no bruit, no  palpable mass.  Lower abdomen in right and left lower quadrants were  normal.  I could not appreciate any palpable stool.  Last bowel movement  occurred about 2 days ago.  RECTAL:  The patient was on her menses.  The rectal tone was normal.  There were small amount of strongly Hemoccult positive stool in the  rectal ampulla.  EXTREMITIES:  No edema.   IMPRESSION:  A 35 year old African-American female with chronic  constipation, which seems to be quite severe.  It is nonprogressive, but  has really not resolved on a high fiber diet.  She may have a colonic  inertia.  She has not abused laxatives in the past.  She does not seem  to have a generalized gastrointestinal hypomotility which would include  her esophagus or her stomach.  She denies any symptoms of gastroparesis  or dysphagia.  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  There also may be an element of outlet obstruction and this needs to be  further evaluated.   PLAN:  1. __________ study for colonic transient time.   This will give [place]      information about her colonic motility.  2. High fiber diet.  Continue prune juice except for the time that we      will be doing her __________ marks.  3. Colonoscopy scheduled, this will rule out redundant colon and a      structural anatomic abnormality of the cecum of the left colon.      Depending on the results of these tests, we will discuss her      laxative regimen but I am almost sure she will have to end up on      some sort of a colonic stimulant. I personally think that prune      juice is a pretty safe laxatives.  I would also consider putting      her back on MiraLax or even Amitiza, which is more expensive, but      may be easier to take.     Megan Richardson. Megan Chance, MD  Electronically Signed    DMB/MedQ  DD: 03/25/2007  DT: 03/25/2007  Job #: [id number]   cc:   Della Goo, M.D.

## 2011-02-08 NOTE — Discharge Summary (Signed)
Megan Richardson, Megan Richardson              ACCOUNT NO.:  [id number]   MEDICAL RECORD NO.:  [id number]          PATIENT TYPE:  INP   LOCATION:  9131                          FACILITY:  [facility]   PHYSICIAN:  Michelle L. Grewal, M.D.DATE OF BIRTH:  20-Feb-1976   DATE OF ADMISSION:  03/29/2005  DATE OF DISCHARGE:  04/03/2005                                 DISCHARGE SUMMARY   ADMITTING DIAGNOSIS:  1.  Intrauterine pregnancy at term  2.  Previous cesarean section desires repeat  3.  Multiparity desires permanent sterilization.   DISCHARGE DIAGNOSIS:  1.  Status post low transverse cesarean section  2.  Viable female infant  3.  Permanent sterilization  4.  Endometritis, resolving.   PROCEDURE:  Repeat low transverse cesarean section and bilateral tubal  ligation.   REASON FOR ADMISSION:  Please see written H&P.   HOSPITAL COURSE:  The patient is a 35 year old gravida 2, para 1 that was  admitted to [facility] for scheduled cesarean section and  bilateral tubal ligation. The patient had had a previous cesarean delivery  and desired repeat. On the morning of admission the patient was taken to the  operating room where spinal anesthesia was administered without difficulty.  Low transverse incision was made with delivery of a viable female infant  weighing 8 pounds 1 ounce with Apgars of 8 at one minute and 9 at 5 minutes.  Bilateral tubal ligation was performed without difficulty. The patient  tolerated procedure well and taken to the recovery room in stable condition.  On postoperative day #1 vital signs stable. She was afebrile. Abdomen soft  with good return of bowel function. The patient was ambulatory however, she  was somewhat dizzy with ambulation. Foley had been discontinued and the  patient was voiding without difficulty. Laboratory findings revealed  hemoglobin of 6.2, platelet count of 246,000 and WBC count of 8.4. Later  that afternoon the patient was noted to have a  temperature of 101.1 and IV  antibiotics Unasyn 3 grams was started. CBC was ordered for the a.m. On the  following morning the patient was now ambulating without difficulty. Vital  signs were stable. Temperature max was 101. She was afebrile at the time of  rounding. Abdomen soft with good bowel sounds. Abdominal dressing had been  removed revealing an incision that was clean, dry and intact. Laboratory  findings revealed hemoglobin of 5.4, platelet count of 247,000 and WBC count  of 8.8. Thus IV antibiotics were continued and temperature was noted to be  elevated to 102.3 and breast engorgement was also noted without erythema. On  postoperative day #3 vital signs were stable with temperature max of 100.9  and 99.7 at the time of rounding. Abdomen soft. Fundus was firm. However,  slightly tender to palpation. Incision was clean, dry and intact. Laboratory  findings revealed hemoglobin of 6.0, platelet count 307,000 and WBC count of  9.0. Ice packs were applied to the breast and continued with IV antibiotics.  Later that evening temperature was noted to again by to 101.5 and decision  was made to perform chest x-ray, a  cath UA and CT of the pelvis. Urinalysis  returned which was within normal limits. Chest x-ray revealed some small  atelectasis. CT scan did note a small hemorrhage in the right gutter and  around the anterior uterus. No abscess was observed. IV antibiotics were now  changed to Rocephin 2 grams IV q.12 and CBC was ordered for the a.m. On  postoperative day #4 the patient was feeling better with minimal pain.  Temperature was noted to be 100.8. Otherwise vital signs were stable.  Abdomen soft. Uterus was mildly tender. Incision was clean, dry and intact.  Hemoglobin was stable at 5.9. IV antibiotics, Rocephin continued. On  postoperative day #5 the patient had significant improvement in her  discomfort. She did desire discharge. Vital signs were stable. She was  afebrile at  the time of rounding. Temperature max of 100 at 2:00 p.m. on the  previous day, 99.2 at 6 a.m. Abdomen soft. Fundus was firm with minimal  tenderness. Incision was clean, dry and intact. Staples were removed and the  patient was discharged home.   CONDITION ON DISCHARGE:  Stable.   DIET:  Regular as tolerated.   ACTIVITY:  No heavy lifting, no driving x2 weeks, no vaginal entry.   FOLLOW-UP:  Patient follow up in the office in 2 days for an incision check.  She is to call for temperature greater than 100 degrees, persistent nausea  and vomiting, heavy vaginal bleeding and/or redness or drainage from  incisional site.   DISCHARGE MEDICATIONS:  1.  Levaquin 500 milligrams #14 one p.o. b.i.d. times 7 days.  2.  Percocet 5/325 #30 without refill one p.o. every 4 to 6 hours p.r.n.  3.  Tandem #30 one p.o. daily. Encourage the patient to take with juice.  4.  Prenatal vitamins one p.o. daily  5.  Colace one p.o. daily p.r.n.       CC/MEDQ  D:  04/24/2005  T:  04/24/2005  Job:  [id number]

## 2011-02-08 NOTE — Op Note (Signed)
Megan Richardson, Megan Richardson              ACCOUNT NO.:  [id number]   MEDICAL RECORD NO.:  [id number]          PATIENT TYPE:  INP   LOCATION:  NA                            FACILITY:  [facility]   PHYSICIAN:  Michelle L. Grewal, M.D.DATE OF BIRTH:  1975-10-14   DATE OF PROCEDURE:  03/29/2005  DATE OF DISCHARGE:                                 OPERATIVE REPORT   PREOPERATIVE DIAGNOSES:  1.  Intrauterine pregnancy at term.  2.  Previous cesarean section.  3.  Desires permanent sterilization.   POSTOPERATIVE DIAGNOSES:  1.  Intrauterine pregnancy at term.  2.  Previous cesarean section.  3.  Desires permanent sterilization.   PROCEDURES:  1.  Repeat low transverse cesarean section.  2.  Bilateral tubal ligation, modified Pomeroy method.   SURGEON:  Michelle L. Vincente Poli, M.D.   ANESTHESIA:  Spinal.   SPECIMENS:  Fallopian tube segments.  Female infant, Apgars 8 at one minute  and 9 at five minutes.   ESTIMATED BLOOD LOSS:  500 mL.   COMPLICATIONS:  None.   PROCEDURE:  The patient was taken to the operating room.  She was then given  her spinal without incident.  She was prepped and draped in the usual  sterile fashion.  A Foley catheter was inserted.  A sterile drape was  applied.  A low transverse incision was made, carried down to the fascia,  fascia scored in the midline and extended laterally.  Rectus muscles were  separated in the midline and the peritoneum was entered bluntly.  The  peritoneal incision was then stretched.  The bladder blade was inserted.  The lower uterine segment was identified, and the bladder flap was created  sharply and then digitally.  The bladder blade was readjusted.  A low  transverse incision was made in the uterus.  The amniotic fluid was noted to  be clear.  The baby was delivered in cephalic presentation with one gentle  pull of the vacuum.  The baby was a female infant, Apgars 8 at one minute  and 9 at five minutes.  Cord blood was obtained after the  cord was clamped  and cut.  The placenta was manually removed and noted to be normal and  intact with three-vessel cord.  The placenta, after it was removed, the  uterus was exteriorized and was cleared of all clots and debris.  The  uterine incision was closed in one layer using 0 chromic in a continuous  running locked stitch.  Hemostasis was noted.  Attention was turned to the  tubes, where a modified bilateral tubal ligation was performed.  Each  fimbriated end was visualized.  The midportion of each tube was grasped with  a Babcock clamp and a 3 cm knuckle was cut off using plain gut suture x2.  We then returned the uterus carefully to the abdomen and irrigation  performed.  All three surgical pedicles were inspected, hemostasis was  noted, and the peritoneum was closed using 0 Vicryl in continuous running  stitch.  The rectus muscles were reapproximated using the same 0 Vicryl.  The fascia  was  closed using 0 Vicryl in continuous running stitch, starting at each  corner and meeting in the midline.  After irrigation of the subcutaneous  layers, the skin was closed with staples.  All sponge, lap and instrument  counts were correct x2.  The patient went to the recovery room in stable  condition.       MLG/MEDQ  D:  03/29/2005  T:  03/29/2005  Job:  [id number]

## 2011-02-11 ENCOUNTER — Other Ambulatory Visit: Payer: Self-pay | Admitting: Internal Medicine

## 2011-02-11 ENCOUNTER — Ambulatory Visit (INDEPENDENT_AMBULATORY_CARE_PROVIDER_SITE_OTHER): Payer: Self-pay | Admitting: Internal Medicine

## 2011-02-11 ENCOUNTER — Encounter: Payer: Self-pay | Admitting: Internal Medicine

## 2011-02-11 VITALS — BP 104/71 | HR 67 | Temp 97.1°F | Ht 67.0 in | Wt 164.3 lb

## 2011-02-11 DIAGNOSIS — N6310 Unspecified lump in the right breast, unspecified quadrant: Secondary | ICD-10-CM

## 2011-02-11 DIAGNOSIS — N63 Unspecified lump in unspecified breast: Secondary | ICD-10-CM

## 2011-02-11 NOTE — Patient Instructions (Signed)
Please make a followup appointment in 3 months. Months. please make to the appointment to the breast clinic for evaluation of a breast mass with mammogram and ultrasound if needed.

## 2011-02-11 NOTE — Progress Notes (Signed)
  Subjective:    Patient ID: Megan Richardson, female    DOB: 1976/09/21, 35 y.o.   MRN: [id number]  HPI Ms. Antu is a pleasant vesicular woman with past history of anxiety, constipation who comes to the clinic with chief complaint of right breast pain for last 3-4 months. She does manual breast exam regularly and says that she has tenderness right upper and outer quadrant of her breast and feels a mass which is about the same for last 3-4 months. She also says that she had this little mass when she was pregnant with her daughter about 6 years before but she was told to watch it. It didn't give any problems to her until last 4 months when she started having pain in her right breast while she walks and also tenderness on exam. She denies any relation with her menstrual cycle of the pain or the mass, also denies any redness, trauma, discharge from the nipple, fever, recent weight loss, change in appetite. She does have a family history of breast cancer in her grandmother who died at age of 5, of breast cancer. LMP was on May 13. Denies any chest pain, shortness of breath, abdominal pain, urinary abnormalities    Review of Systems    as per history of present illness Objective:   Physical Exam    Constitutional: Vital signs reviewed.  Patient is a well-developed and well-nourished in no acute distress and cooperative with exam. Alert and oriented x3.  Head: Normocephalic and atraumatic Mouth: no erythema or exudates, MMM Eyes: PERRL, EOMI, conjunctivae normal, No scleral icterus.  Neck: Supple, Trachea midline normal ROM, No JVD, mass, thyromegaly, or carotid bruit present.  Cardiovascular: RRR, S1 normal, S2 normal, no MRG, pulses symmetric and intact bilaterally Pulmonary/Chest: CTAB, no wheezes, rales, or rhonchi Abdominal: Soft. Non-tender, non-distended, bowel sounds are normal, no masses, organomegaly, or guarding present.  GU: no CVA tenderness Musculoskeletal: No joint deformities,  erythema, or stiffness, ROM full and no nontender Neurological: A&O x3, Strenght is normal and symmetric bilaterally, cranial nerve II-XII are grossly intact, no focal motor deficit, sensory intact to light touch bilaterally.  Skin: Warm, dry and intact. No rash, cyanosis, or clubbing.  Breast exam-mildly tender mass felt on right upper outer quadrant, no masses in other quadrants or left breast.    Assessment & Plan:

## 2011-02-11 NOTE — Assessment & Plan Note (Signed)
Palpable mildly tender or right upper quadrant mass.  I Will refer to the breast clinic for a diagnostic mammogram and talked with Dr. Mayford Knife over there. They will do mammogram, examined her and felt needed will do ultrasound and necessary referrals as needed.

## 2011-02-15 ENCOUNTER — Ambulatory Visit
Admission: RE | Admit: 2011-02-15 | Discharge: 2011-02-15 | Disposition: A | Discharge status: Home / Self Care | Payer: PRIVATE HEALTH INSURANCE | Source: Ambulatory Visit | Attending: Family Medicine | Admitting: Family Medicine

## 2011-02-15 ENCOUNTER — Ambulatory Visit
Admission: RE | Admit: 2011-02-15 | Discharge: 2011-02-15 | Disposition: A | Discharge status: Home / Self Care | Payer: Self-pay | Source: Ambulatory Visit | Attending: Family Medicine | Admitting: Family Medicine

## 2011-02-15 DIAGNOSIS — N6310 Unspecified lump in the right breast, unspecified quadrant: Secondary | ICD-10-CM

## 2011-06-17 ENCOUNTER — Ambulatory Visit (INDEPENDENT_AMBULATORY_CARE_PROVIDER_SITE_OTHER): Payer: Self-pay

## 2011-06-17 ENCOUNTER — Inpatient Hospital Stay (INDEPENDENT_AMBULATORY_CARE_PROVIDER_SITE_OTHER)
Admission: RE | Admit: 2011-06-17 | Discharge: 2011-06-17 | Disposition: A | Discharge status: Home / Self Care | Payer: Self-pay | Source: Ambulatory Visit | Attending: Family Medicine | Admitting: Family Medicine

## 2011-06-17 DIAGNOSIS — R071 Chest pain on breathing: Secondary | ICD-10-CM

## 2011-06-19 ENCOUNTER — Other Ambulatory Visit: Payer: Self-pay | Admitting: *Deleted

## 2011-06-20 MED ORDER — ALPRAZOLAM 0.25 MG PO TABS: 0.2500 mg | ORAL_TABLET | Freq: Three times a day (TID) | ORAL | Status: DC | PRN

## 2011-06-20 NOTE — Telephone Encounter (Signed)
Refill was faxed to [facility] per pt request.

## 2011-06-24 LAB — CBC
HCT: 39.2
Hemoglobin: 13
MCV: 96.1
Platelets: 334
RBC: 4.08
WBC: 10.1

## 2011-06-24 LAB — BASIC METABOLIC PANEL
BUN: 15
Calcium: 9.3
Creatinine, Ser: 0.68
GFR calc Af Amer: >60
GFR calc non Af Amer: >60

## 2011-06-24 LAB — DIFFERENTIAL
Eosinophils Absolute: 0
Eosinophils Relative: 0
Lymphs Abs: 1.7
Monocytes Absolute: 0.3
Monocytes Relative: 3

## 2011-08-07 ENCOUNTER — Other Ambulatory Visit: Payer: Self-pay | Admitting: *Deleted

## 2011-08-07 MED ORDER — FLUTICASONE-SALMETEROL 250-50 MCG/DOSE IN AEPB: 1.0000 | INHALATION_SPRAY | Freq: Two times a day (BID) | RESPIRATORY_TRACT | Status: DC

## 2011-08-07 MED ORDER — ALBUTEROL SULFATE HFA 108 (90 BASE) MCG/ACT IN AERS: 2.0000 | INHALATION_SPRAY | Freq: Four times a day (QID) | RESPIRATORY_TRACT | Status: DC | PRN

## 2011-08-07 NOTE — Telephone Encounter (Signed)
Ventolin and Advaird rx refill request faxed to [facility]

## 2011-08-30 ENCOUNTER — Encounter: Payer: Self-pay | Admitting: Internal Medicine

## 2011-08-30 ENCOUNTER — Ambulatory Visit (INDEPENDENT_AMBULATORY_CARE_PROVIDER_SITE_OTHER): Payer: Self-pay | Admitting: Internal Medicine

## 2011-08-30 VITALS — BP 107/74 | HR 68 | Temp 97.4°F | Ht 67.0 in | Wt 161.7 lb

## 2011-08-30 DIAGNOSIS — E785 Hyperlipidemia, unspecified: Secondary | ICD-10-CM | POA: Insufficient documentation

## 2011-08-30 DIAGNOSIS — F419 Anxiety disorder, unspecified: Secondary | ICD-10-CM

## 2011-08-30 DIAGNOSIS — Z23 Encounter for immunization: Secondary | ICD-10-CM

## 2011-08-30 DIAGNOSIS — J45909 Unspecified asthma, uncomplicated: Secondary | ICD-10-CM

## 2011-08-30 DIAGNOSIS — F411 Generalized anxiety disorder: Secondary | ICD-10-CM

## 2011-08-30 LAB — COMPREHENSIVE METABOLIC PANEL
BUN: 9 mg/dL (ref 6–23)
CO2: 26 mEq/L (ref 19–32)
Calcium: 9.3 mg/dL (ref 8.4–10.5)
Chloride: 106 mEq/L (ref 96–112)
Creat: 0.65 mg/dL (ref 0.50–1.10)
Glucose, Bld: 90 mg/dL (ref 70–99)
Total Bilirubin: 0.3 mg/dL (ref 0.3–1.2)

## 2011-08-30 LAB — LIPID PANEL
Cholesterol: 157 mg/dL (ref 0–200)
Total CHOL/HDL Ratio: 4.6 Ratio
Triglycerides: 99 mg/dL (ref <150)
VLDL: 20 mg/dL (ref 0–40)

## 2011-08-30 MED ORDER — FLUOXETINE HCL 20 MG PO CAPS: 20.0000 mg | ORAL_CAPSULE | Freq: Every day | ORAL | Status: DC

## 2011-08-30 MED ORDER — ALPRAZOLAM 0.25 MG PO TABS: 0.2500 mg | ORAL_TABLET | Freq: Three times a day (TID) | ORAL | Status: DC | PRN

## 2011-08-30 NOTE — Assessment & Plan Note (Signed)
Given her anxiety I will start her on Prozac 20 mg daily, and continue Xanax when necessary for now. Present need to be titrated, patient informed it may take up to 6 weeks for full effect

## 2011-08-30 NOTE — Assessment & Plan Note (Signed)
At baseline, with only mild wheezing in upper fields, no need for further workup or medication change at this point

## 2011-08-30 NOTE — Progress Notes (Signed)
  Subjective:    Patient ID: Megan Richardson, female    DOB: 1976-06-24, 35 y.o.   MRN: [id number]  HPI  Patient is a pleasant 35 year old female with a past medical history listed below, presents to the outpatient clinic for routine followup and complains of flulike illness last week which has since subsided, however given to the patient is asthmatic she would like to be reassured that her lungs are clear of infection, also patient complains of yellowing of her skin during the flulike symptoms, reports that she has anxiety but no depression, anxiety is present on daily basis and she takes only Xanax present for this. Reports that she would like to have a repeat lipid panel given that hyperlipidemia is prevalent in her family. Denies any other complaints  Patient Active Problem List  Diagnoses  . ANXIETY STATE, UNSPECIFIED  . ASTHMA  . CONSTIPATION, CHRONIC  . THROAT PAIN, CHRONIC  . PALPITATIONS, CHRONIC  . Breast mass, right   Current Outpatient Prescriptions on File Prior to Visit  Medication Sig Dispense Refill  . albuterol (VENTOLIN HFA) 108 (90 BASE) MCG/ACT inhaler Inhale 2 puffs into the lungs every 6 (six) hours as needed.  3 Inhaler  3  . Fluticasone-Salmeterol (ADVAIR DISKUS) 250-50 MCG/DOSE AEPB Inhale 1 puff into the lungs every 12 (twelve) hours.  60 each  3   No Known Allergies   Review of Systems  Respiratory: Positive for wheezing. Negative for apnea, cough, chest tightness and shortness of breath.   All other systems reviewed and are negative.       Objective:   Physical Exam  Nursing note and vitals reviewed. Constitutional: She is oriented to person, place, and time. She appears well-developed and well-nourished.  HENT:  Head: Normocephalic and atraumatic.  Eyes: Pupils are equal, round, and reactive to light.  Neck: Normal range of motion. Neck supple. No JVD present. No thyromegaly present.  Cardiovascular: Normal rate, regular rhythm and normal heart  sounds.   No murmur heard. Pulmonary/Chest: Effort normal. She has wheezes in the right upper field and the left upper field. She has no rales.  Abdominal: Soft. Bowel sounds are normal.  Musculoskeletal: Normal range of motion. She exhibits no edema.  Neurological: She is alert and oriented to person, place, and time.  Skin: Skin is warm and dry.          Assessment & Plan:

## 2011-08-30 NOTE — Patient Instructions (Signed)
Please taking no anxiety medication called fluoxetine once daily, it may take up to 4 weeks for full effect.

## 2011-08-30 NOTE — Assessment & Plan Note (Signed)
We'll check fasting lipid and LFTs, if her LDL is above 160 we'll start treatment, if below 160 we will recommend diet management alone

## 2011-10-29 ENCOUNTER — Other Ambulatory Visit: Payer: Self-pay | Admitting: *Deleted

## 2011-10-29 DIAGNOSIS — F419 Anxiety disorder, unspecified: Secondary | ICD-10-CM

## 2011-10-29 MED ORDER — ALPRAZOLAM 0.25 MG PO TABS: 0.2500 mg | ORAL_TABLET | Freq: Three times a day (TID) | ORAL | Status: DC | PRN

## 2011-10-30 NOTE — Telephone Encounter (Signed)
CALLED TO PHARM.

## 2011-12-16 ENCOUNTER — Encounter: Payer: Self-pay | Admitting: Internal Medicine

## 2011-12-18 ENCOUNTER — Encounter: Payer: Self-pay | Admitting: Internal Medicine

## 2012-02-11 ENCOUNTER — Other Ambulatory Visit: Payer: Self-pay | Admitting: *Deleted

## 2012-02-11 DIAGNOSIS — F419 Anxiety disorder, unspecified: Secondary | ICD-10-CM

## 2012-02-11 MED ORDER — FLUOXETINE HCL 20 MG PO CAPS: 20.0000 mg | ORAL_CAPSULE | Freq: Every day | ORAL | Status: DC

## 2012-02-11 NOTE — Telephone Encounter (Signed)
Refill approved - nurse to complete.  Please schedule a follow-up appointment within 1 month. 

## 2012-02-11 NOTE — Telephone Encounter (Signed)
Rx called into pharmacy - [facility]. Flag sent to front desk pool to make appt.

## 2012-03-10 ENCOUNTER — Encounter: Payer: Self-pay | Admitting: Internal Medicine

## 2012-03-11 ENCOUNTER — Ambulatory Visit (INDEPENDENT_AMBULATORY_CARE_PROVIDER_SITE_OTHER): Payer: Self-pay | Admitting: Internal Medicine

## 2012-03-11 ENCOUNTER — Encounter: Payer: Self-pay | Admitting: Internal Medicine

## 2012-03-11 VITALS — BP 112/69 | HR 68 | Temp 97.0°F | Ht 67.0 in | Wt 155.0 lb

## 2012-03-11 DIAGNOSIS — F411 Generalized anxiety disorder: Secondary | ICD-10-CM

## 2012-03-11 DIAGNOSIS — J45909 Unspecified asthma, uncomplicated: Secondary | ICD-10-CM

## 2012-03-11 DIAGNOSIS — F419 Anxiety disorder, unspecified: Secondary | ICD-10-CM

## 2012-03-11 MED ORDER — FLUTICASONE-SALMETEROL 250-50 MCG/DOSE IN AEPB: 1.0000 | INHALATION_SPRAY | Freq: Two times a day (BID) | RESPIRATORY_TRACT | Status: DC

## 2012-03-11 MED ORDER — CLONAZEPAM 0.5 MG PO TABS: 0.5000 mg | ORAL_TABLET | Freq: Two times a day (BID) | ORAL | Status: DC | PRN

## 2012-03-11 MED ORDER — ALBUTEROL SULFATE HFA 108 (90 BASE) MCG/ACT IN AERS: 2.0000 | INHALATION_SPRAY | Freq: Four times a day (QID) | RESPIRATORY_TRACT | Status: DC | PRN

## 2012-03-11 MED ORDER — FLUOXETINE HCL 20 MG PO CAPS: 20.0000 mg | ORAL_CAPSULE | Freq: Every day | ORAL | Status: DC

## 2012-03-11 NOTE — Assessment & Plan Note (Addendum)
Overall anxiety seems to be well controlled in spite of her recent life stressors. She states fluoxetine has helped dramatically. She has only used 60 Xanax since February. I have no concern for overuse/abuse. However, as she states she doesn't really have panic attacks, but more increased periods of general anxiety, I would like to try clonazepam, as it is longer acting. I will prescribe two-week supply. She will try the medication for 2 weeks and determine if she would rather continue clonazepam or restart alprazolam. She will call the clinic and I would be grateful if the attending who is covering my patients would phone in the patient's preferred benzodiazepine.

## 2012-03-11 NOTE — Progress Notes (Signed)
Subjective:     Patient ID: Megan Richardson, female   DOB: 1975-10-06, 36 y.o.   MRN: [id number]  Megan Richardson is a 52 year woman with past medical history significant for anxiety, panic attacks, depression and asthma who presents for followup regarding her anxiety and med refills.  Anxiety Presents for follow-up visit. Symptoms include decreased concentration, depressed mood, panic and shortness of breath. Patient reports no chest pain, feeling of choking or suicidal ideas. Primary symptoms comment: States has not had panic attacks in about 6 months, but her anxiety is increased because her husband is in the hospital Symptoms occur occasionally. The severity of symptoms is moderate, interfering with daily activities and incapacitating. The quality of sleep is poor. Nighttime awakenings: several.   Her past medical history is significant for asthma. Compliance with medications is 76-100%.  Asthma She complains of chest tightness and shortness of breath. There is no difficulty breathing. Primary symptoms comments: Overall asthma is very stable. She requires a rescue inhaler about 2-3 days per week for chest tightness and shortness of breath. she denies any night symptoms.. This is a chronic problem. The current episode started more than 1 year ago. The problem occurs every several days. The problem has been gradually improving (She states her symptoms have improved drastically since starting maintenance therapy with Advair). Pertinent negatives include no chest pain. She reports moderate improvement on treatment. Her past medical history is significant for asthma.     Review of Systems  Respiratory: Positive for shortness of breath.   Cardiovascular: Negative for chest pain.  Psychiatric/Behavioral: Positive for decreased concentration. Negative for suicidal ideas.       Objective:   Physical Exam  Constitutional: She is oriented to person, place, and time. She appears well-developed and  well-nourished. No distress.  Pulmonary/Chest: Effort normal and breath sounds normal. No respiratory distress.  Neurological: She is alert and oriented to person, place, and time. No cranial nerve deficit.  Skin: Skin is warm and dry. She is not diaphoretic.  Psychiatric: She has a normal mood and affect. Her behavior is normal. Judgment and thought content normal.       Assessment:     Case discussed with Dr. Rogelia Boga. Please see problem oriented charting for assessment and plan by problem (best viewed under encounters tab).

## 2012-03-11 NOTE — Patient Instructions (Addendum)
Please go to [facility] for further management of your anxiety. I have provided you with a 2 week prescription for clonazepam. If you do not like this medication and want to go back to taking alprazolam (Xanax), please call our clinic and we will provide you prescription over the phone.  The proper technique for using your inhaler is as follows: Take a deep breath in and exhale completely. With you next deep breath in, activate your inhaler and inhale the medication as deeply as you can. Hold this breath in for 7 full seconds. This counts as one puff.   Return to clinic to see your new PCP in December of 2013. Please bring all your medications to your next clinic appointment.    Anxiety and Panic Attacks Your caregiver has informed you that you are having an anxiety or panic attack. There may be many forms of this. Most of the time these attacks come suddenly and without warning. They come at any time of day, including periods of sleep, and at any time of life. They may be strong and unexplained. Although panic attacks are very scary, they are physically harmless. Sometimes the cause of your anxiety is not known. Anxiety is a protective mechanism of the body in its fight or flight mechanism. Most of these perceived danger situations are actually nonphysical situations (such as anxiety over losing a job). CAUSES  The causes of an anxiety or panic attack are many. Panic attacks may occur in otherwise healthy people given a certain set of circumstances. There may be a genetic cause for panic attacks. Some medications may also have anxiety as a side effect. SYMPTOMS  Some of the most common feelings are:  Intense terror.   Dizziness, feeling faint.   Hot and cold flashes.   Fear of going crazy.   Feelings that nothing is real.   Sweating.   Shaking.   Chest pain or a fast heartbeat (palpitations).   Smothering, choking sensations.   Feelings of impending doom and that death is near.    Tingling of extremities, this may be from over-breathing.   Altered reality (derealization).   Being detached from yourself (depersonalization).  Several symptoms can be present to make up anxiety or panic attacks. DIAGNOSIS  The evaluation by your caregiver will depend on the type of symptoms you are experiencing. The diagnosis of anxiety or panic attack is made when no physical illness can be determined to be a cause of the symptoms. TREATMENT  Treatment to prevent anxiety and panic attacks may include:  Avoidance of circumstances that cause anxiety.   Reassurance and relaxation.   Regular exercise.   Relaxation therapies, such as yoga.   Psychotherapy with a psychiatrist or therapist.   Avoidance of caffeine, alcohol and illegal drugs.   Prescribed medication.  SEEK IMMEDIATE MEDICAL CARE IF:   You experience panic attack symptoms that are different than your usual symptoms.   You have any worsening or concerning symptoms.  Document Released: 09/09/2005 Document Revised: 08/29/2011 Document Reviewed: 01/11/2010 [facility] Patient Information 2012 [place], [place].

## 2012-03-11 NOTE — Assessment & Plan Note (Signed)
Well-controlled. Prescriptions given for albuterol and Advair.

## 2012-03-31 ENCOUNTER — Other Ambulatory Visit: Payer: Self-pay | Admitting: *Deleted

## 2012-03-31 DIAGNOSIS — F419 Anxiety disorder, unspecified: Secondary | ICD-10-CM

## 2012-03-31 MED ORDER — ALPRAZOLAM 0.25 MG PO TABS: 0.2500 mg | ORAL_TABLET | Freq: Three times a day (TID) | ORAL | Status: DC | PRN

## 2012-03-31 NOTE — Telephone Encounter (Signed)
Dr Candy Sledge discussed her case with me. He documented that she only used 22 Xanax from Feb to June (about 5 months). He requested a 6 month F/U (dec 2013). Therefore will give 30 and two refill and expect that to more than last until Dec appt - am actually giving more than what I calc out she needs.

## 2012-03-31 NOTE — Telephone Encounter (Signed)
Pt states she does not think clonazepam worked and she does not like it wants script for alprazolam refilled.

## 2012-04-02 NOTE — Telephone Encounter (Signed)
Called to [facility] [address], pt informed

## 2012-05-01 ENCOUNTER — Telehealth: Payer: Self-pay | Admitting: *Deleted

## 2012-05-01 NOTE — Telephone Encounter (Signed)
[facility] asked if pt is still on Prozac 20 mg daily.  They state pt said it was d/c. I still have it listed on MAR I called pt and she stopped taking it herself.  She feels better off it. She will talk with you about it at next visit.

## 2012-06-02 ENCOUNTER — Other Ambulatory Visit: Payer: Self-pay | Admitting: Radiation Oncology

## 2012-07-02 ENCOUNTER — Ambulatory Visit (INDEPENDENT_AMBULATORY_CARE_PROVIDER_SITE_OTHER): Payer: Self-pay | Admitting: *Deleted

## 2012-07-02 DIAGNOSIS — Z23 Encounter for immunization: Secondary | ICD-10-CM

## 2012-07-02 DIAGNOSIS — Z299 Encounter for prophylactic measures, unspecified: Secondary | ICD-10-CM

## 2012-12-23 ENCOUNTER — Ambulatory Visit: Payer: Self-pay

## 2013-02-17 ENCOUNTER — Emergency Department (HOSPITAL_COMMUNITY)
Admission: EM | Admit: 2013-02-17 | Discharge: 2013-02-17 | Disposition: A | Discharge status: Home / Self Care | Payer: No Typology Code available for payment source | Attending: Emergency Medicine | Admitting: Emergency Medicine

## 2013-02-17 ENCOUNTER — Encounter (HOSPITAL_COMMUNITY): Payer: Self-pay | Admitting: *Deleted

## 2013-02-17 DIAGNOSIS — K089 Disorder of teeth and supporting structures, unspecified: Secondary | ICD-10-CM | POA: Insufficient documentation

## 2013-02-17 DIAGNOSIS — R509 Fever, unspecified: Secondary | ICD-10-CM | POA: Insufficient documentation

## 2013-02-17 DIAGNOSIS — K0889 Other specified disorders of teeth and supporting structures: Secondary | ICD-10-CM

## 2013-02-17 DIAGNOSIS — Z8659 Personal history of other mental and behavioral disorders: Secondary | ICD-10-CM | POA: Insufficient documentation

## 2013-02-17 DIAGNOSIS — Z8679 Personal history of other diseases of the circulatory system: Secondary | ICD-10-CM | POA: Insufficient documentation

## 2013-02-17 DIAGNOSIS — R0982 Postnasal drip: Secondary | ICD-10-CM | POA: Insufficient documentation

## 2013-02-17 DIAGNOSIS — J3489 Other specified disorders of nose and nasal sinuses: Secondary | ICD-10-CM | POA: Insufficient documentation

## 2013-02-17 DIAGNOSIS — Z8619 Personal history of other infectious and parasitic diseases: Secondary | ICD-10-CM | POA: Insufficient documentation

## 2013-02-17 DIAGNOSIS — R51 Headache: Secondary | ICD-10-CM | POA: Insufficient documentation

## 2013-02-17 DIAGNOSIS — J45909 Unspecified asthma, uncomplicated: Secondary | ICD-10-CM | POA: Insufficient documentation

## 2013-02-17 DIAGNOSIS — R519 Headache, unspecified: Secondary | ICD-10-CM

## 2013-02-17 DIAGNOSIS — Z79899 Other long term (current) drug therapy: Secondary | ICD-10-CM | POA: Insufficient documentation

## 2013-02-17 DIAGNOSIS — F17200 Nicotine dependence, unspecified, uncomplicated: Secondary | ICD-10-CM | POA: Insufficient documentation

## 2013-02-17 MED ORDER — IBUPROFEN 800 MG PO TABS
800.0000 mg | ORAL_TABLET | Freq: Once | ORAL | Status: AC
  Administered 2013-02-17: 800 mg via ORAL
  Filled 2013-02-17: qty 1

## 2013-02-17 MED ORDER — PENICILLIN V POTASSIUM 500 MG PO TABS: 500.0000 mg | ORAL_TABLET | Freq: Four times a day (QID) | ORAL | Status: DC

## 2013-02-17 MED ORDER — NAPROXEN 500 MG PO TABS: 500.0000 mg | ORAL_TABLET | Freq: Two times a day (BID) | ORAL | Status: DC

## 2013-02-17 NOTE — ED Provider Notes (Signed)
History     CSN: [id number]  Arrival date & time 02/17/13  0712   First MD Initiated Contact with Patient 02/17/13 [phone]      Chief Complaint  Patient presents with  . Facial Pain    (Consider location/radiation/quality/duration/timing/severity/associated sxs/prior treatment) HPI Comments: 37 y.o. Female with PMHx of asthma and anxiety, presents today complaining of mild dental pain (upper right) that has been persistent for 3 or 4 days. This is a new problem for the pt and she has procured a dental appointment for this problem. Pt stated that she felt drainage in the back of the throat, and then a stuffy nose, only in the left nostril causing some left sided sinus discomfort and headache. Pt has been taking ibuprofen with mild relief. Pt admits low grade fever of 99, some chills. Denies night sweats, difficulty swallowing or opening mouth, SOB, nuchal rigidity or decreased ROM of neck.  Patient does not have a dentist and requests a resource guide at discharge.     Past Medical History  Diagnosis Date  . Asthma     With Exacerbations  . Constipation     SP normal colonoscopy 7/08, GI lebaur Dr. Julio Alm.  . Anxiety   . Hepatitis B immune   . Palpitations     Chronic    Past Surgical History  Procedure Laterality Date  . Tubal ligation  2006  . Cesarean section      Multiple    Family History  Problem Relation Age of Onset  . BRCA 1/2 Maternal Grandmother     History  Substance Use Topics  . Smoking status: Current Some Day Smoker -- 0.10 packs/day    Types: Cigarettes  . Smokeless tobacco: Not on file     Comment: has cut down.  . Alcohol Use: Yes     Comment: Only socially.     OB History   Grav Para Term Preterm Abortions TAB SAB Ect Mult Living                  Review of Systems  Constitutional: Positive for fever and chills.       T max 99  HENT: Positive for rhinorrhea, dental problem, postnasal drip and sinus pressure. Negative for sore throat,  facial swelling, drooling, trouble swallowing, neck pain, neck stiffness and voice change.        Dental problem is upper left, rhinorrhea and sinus pressure are also left sided.   Eyes: Negative for photophobia and visual disturbance.  Respiratory: Negative for cough and shortness of breath.   Cardiovascular: Negative for chest pain.  Gastrointestinal: Negative for nausea, vomiting, abdominal pain and diarrhea.  Genitourinary: Negative for flank pain.  Musculoskeletal: Negative for back pain.  Skin: Negative for rash.  Neurological: Positive for headaches. Negative for weakness, light-headedness and numbness.       Frontal  Psychiatric/Behavioral: The patient is not nervous/anxious.     Allergies  Review of patient's allergies indicates no known allergies.  Home Medications   Current Outpatient Rx  Name  Route  Sig  Dispense  Refill  . albuterol (VENTOLIN HFA) 108 (90 BASE) MCG/ACT inhaler   Inhalation   Inhale 2 puffs into the lungs every 6 (six) hours as needed.   3 Inhaler   11   . Fluticasone-Salmeterol (ADVAIR DISKUS) 250-50 MCG/DOSE AEPB   Inhalation   Inhale 1 puff into the lungs every 12 (twelve) hours.   60 each   11  BP 117/76  Pulse 77  Temp(Src) 99.2 F (37.3 C) (Oral)  Resp 14  SpO2 99%  Physical Exam  Nursing note and vitals reviewed. Constitutional: She is oriented to person, place, and time. She appears well-developed and well-nourished.  HENT:  Head: Normocephalic and atraumatic. No trismus in the jaw.  Right Ear: Tympanic membrane normal.  Left Ear: Tympanic membrane normal.  Nose: No mucosal edema. Left sinus exhibits frontal sinus tenderness.  Mouth/Throat: Normal dentition. No dental abscesses or dental caries. Posterior oropharyngeal erythema present. No oropharyngeal exudate, posterior oropharyngeal edema or tonsillar abscesses.    Oropharynx is clear. Tender at the gumline of teeth #13 and 14 with erythema and edema of the underlying  gingiva. Neck is nontender and supple without adenopathy or JVD.  Neck: Normal range of motion. Neck supple.  Cardiovascular: Normal rate.   Pulmonary/Chest: Effort normal. No respiratory distress. She has no wheezes.  Abdominal: Soft. There is no tenderness.  Musculoskeletal: Normal range of motion.  Neurological: She is alert and oriented to person, place, and time. No cranial nerve deficit.  Skin: Skin is warm and dry.  Psychiatric: She has a normal mood and affect.    ED Course  Procedures (including critical care time)  Labs Reviewed - No data to display No results found.  Medications  ibuprofen (ADVIL,MOTRIN) tablet 800 mg (800 mg Oral Given 02/17/13 0837)    1. Pain, dental   2. Sinus pain   3. Headache     Discharge Medication List as of 02/17/2013  8:31 AM    START taking these medications   Details  naproxen (NAPROSYN) 500 MG tablet Take 1 tablet (500 mg total) by mouth 2 (two) times daily with a meal., Starting 02/17/2013, Until Discontinued, Print    penicillin v potassium (VEETID) 500 MG tablet Take 1 tablet (500 mg total) by mouth 4 (four) times daily., Starting 02/17/2013, Until Discontinued, Print         MDM  Patient with dental pain.  No gross abscess.  Exam unconcerning for Ludwig's angina or spread of infection.  Will treat with penicillin. Pt preferred non narcotic pain med. Urged patient to keep her appointment with dentist.  Provided resource material for PCP and for on call dentist. Discussed options for treatment with pt who understands and is in agreement with discharge plan.    Glade Nurse, PA-C 02/17/13 1946

## 2013-02-17 NOTE — ED Notes (Signed)
Pt states that she has sinus pain, pressure. Pt states that she has had nasal drainage and to back of throat. Pt states that this is to left side only. Pt also has headache and fever.

## 2013-02-19 NOTE — ED Provider Notes (Signed)
Medical screening examination/treatment/procedure(s) were performed by non-physician practitioner and as supervising physician I was immediately available for consultation/collaboration.   David H Yao, MD 02/19/13 1507 

## 2013-05-10 ENCOUNTER — Other Ambulatory Visit: Payer: Self-pay | Admitting: *Deleted

## 2013-05-10 DIAGNOSIS — J45909 Unspecified asthma, uncomplicated: Secondary | ICD-10-CM

## 2013-05-10 NOTE — Telephone Encounter (Signed)
Not seen [place] in over one year. We are not listed as PCP. Sent Megan Richardson a message to see why we were removed.

## 2013-05-11 NOTE — Telephone Encounter (Signed)
Call from pt -  stated she had not called for med refill;stated she comes to the clinic for an appt as needed. But will call back to schedule an appt when I told her the refill is for Avair disk.

## 2013-06-02 ENCOUNTER — Encounter: Payer: Self-pay | Admitting: Internal Medicine

## 2013-06-02 ENCOUNTER — Ambulatory Visit (INDEPENDENT_AMBULATORY_CARE_PROVIDER_SITE_OTHER): Payer: No Typology Code available for payment source | Admitting: Internal Medicine

## 2013-06-02 VITALS — BP 117/79 | HR 65 | Temp 97.9°F | Ht 67.0 in | Wt 144.5 lb

## 2013-06-02 DIAGNOSIS — J45909 Unspecified asthma, uncomplicated: Secondary | ICD-10-CM

## 2013-06-02 DIAGNOSIS — F411 Generalized anxiety disorder: Secondary | ICD-10-CM

## 2013-06-02 DIAGNOSIS — Z23 Encounter for immunization: Secondary | ICD-10-CM

## 2013-06-02 DIAGNOSIS — E785 Hyperlipidemia, unspecified: Secondary | ICD-10-CM

## 2013-06-02 DIAGNOSIS — Z0000 Encounter for general adult medical examination without abnormal findings: Secondary | ICD-10-CM

## 2013-06-02 LAB — CBC WITH DIFFERENTIAL/PLATELET
Basophils Absolute: 0 10*3/uL (ref 0.0–0.1)
Eosinophils Relative: 2 % (ref 0–5)
Lymphocytes Relative: 43 % (ref 12–46)
Lymphs Abs: 2.2 10*3/uL (ref 0.7–4.0)
MCV: 87.1 fL (ref 78.0–100.0)
Neutro Abs: 2.4 10*3/uL (ref 1.7–7.7)
Platelets: 387 10*3/uL (ref 150–400)
RBC: 3.8 MIL/uL — ABNORMAL LOW (ref 3.87–5.11)
RDW: 16 % — ABNORMAL HIGH (ref 11.5–15.5)
WBC: 5.2 10*3/uL (ref 4.0–10.5)

## 2013-06-02 LAB — LIPID PANEL
Cholesterol: 179 mg/dL (ref 0–200)
HDL: 50 mg/dL (ref >39)
Total CHOL/HDL Ratio: 3.6 Ratio
Triglycerides: 64 mg/dL (ref <150)

## 2013-06-02 LAB — COMPLETE METABOLIC PANEL WITH GFR
AST: 15 U/L (ref 0–37)
Albumin: 4.1 g/dL (ref 3.5–5.2)
Alkaline Phosphatase: 72 U/L (ref 39–117)
BUN: 11 mg/dL (ref 6–23)
Calcium: 9.3 mg/dL (ref 8.4–10.5)
Chloride: 104 mEq/L (ref 96–112)
Creat: 0.68 mg/dL (ref 0.50–1.10)
GFR, Est Non African American: >89 mL/min
Glucose, Bld: 82 mg/dL (ref 70–99)

## 2013-06-02 MED ORDER — ALBUTEROL SULFATE HFA 108 (90 BASE) MCG/ACT IN AERS: 2.0000 | INHALATION_SPRAY | Freq: Four times a day (QID) | RESPIRATORY_TRACT | Status: DC | PRN

## 2013-06-02 MED ORDER — FLUTICASONE-SALMETEROL 250-50 MCG/DOSE IN AEPB: 1.0000 | INHALATION_SPRAY | Freq: Two times a day (BID) | RESPIRATORY_TRACT | Status: DC

## 2013-06-02 NOTE — Patient Instructions (Addendum)
General Instructions: 1. You have been provided prescriptions for your asthma medications.  Please return to clinic in 3-4 weeks for PAP smear and for follow-up.   2. Please take all medications as prescribed.    3. If you have worsening of your symptoms or new symptoms arise, please call the clinic ([phone]), or go to the ER immediately if symptoms are severe.     Treatment Goals:  Goals (1 Years of Data) as of 06/02/13   None      Progress Toward Treatment Goals:    Self Care Goals & Plans:  Self Care Goal 06/02/2013  Manage my medications take my medicines as prescribed; bring my medications to every visit; refill my medications on time  Eat healthy foods drink diet soda or water instead of juice or soda; eat more vegetables; eat foods that are low in salt; eat baked foods instead of fried foods; eat fruit for snacks and desserts

## 2013-06-02 NOTE — Assessment & Plan Note (Signed)
Assessment:  LDL 102 two years ago.  Patient has not had routine labs in over a year.  Labs in the past have been normal.  She is fasting this morning.  Plan: Check FLP, CMP, CBC

## 2013-06-02 NOTE — Assessment & Plan Note (Addendum)
Assessment:  Asthma is well-controlled on Advair q12h and albuterol prn.  She uses her inhaler 3-4 times per week and has not had to increase use.    Plan:  Refilled Advair and albuterol

## 2013-06-02 NOTE — Progress Notes (Signed)
  Subjective:    Patient ID: Francine Graven, female    DOB: Sep 27, 1975, 37 y.o.   MRN: [id number]  HPI Comments: Ms. Mcphie is a 37 year old woman with PMH of asthma, chronic constipation and anxiety.  She is here for refill of her asthma medications.  She is compliant with Advair and uses her albuterol inhaler as needed (about 3-4 times/ week).  Her only complaint today is anxiety symptoms.  She has tried several medications in the past and was most recently on Prozac but discontinued it because she felt it made her tired.  She is caring for her two children and her sick husband and she feels the stress is contributing to her anxiety.     Review of Systems  Constitutional: Negative for fever and fatigue.  Respiratory: Negative for shortness of breath.   Cardiovascular: Negative for chest pain.  Gastrointestinal: Positive for constipation. Negative for diarrhea and blood in stool.  Genitourinary: Negative for dysuria and hematuria.       Objective:   Physical Exam  Constitutional: She is oriented to person, place, and time. She appears well-developed and well-nourished. No distress.  Cardiovascular: Normal rate, regular rhythm and normal heart sounds.   No murmur heard. Pulmonary/Chest: Effort normal and breath sounds normal. No respiratory distress. She has no wheezes. She has no rales.  Abdominal: Soft. Bowel sounds are normal. She exhibits no distension. There is no tenderness.  Musculoskeletal: She exhibits no edema and no tenderness.  Neurological: She is alert and oriented to person, place, and time.  Skin: Skin is warm. She is not diaphoretic.  Psychiatric: She has a normal mood and affect. Her behavior is normal.          Assessment & Plan:  Please see problem based assessment and plan.

## 2013-06-02 NOTE — Assessment & Plan Note (Addendum)
Assessment: Patient with life stressors that are contributing to her anxiety.  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  She would like to resume medications but declines starting either Lexapro or Buspirone today.  She also declines restarting Prozac.  She says she has tried these medications in the past and doesn't like how they make her feel.   Plan:    1)  Patient declines starting either Lexapro or Buspirone today.   2)  Patient agrees to follow-up on this issue at clinic visit in 1 month

## 2013-06-07 NOTE — Progress Notes (Signed)
I saw and evaluated the patient.  I personally confirmed the key portions of the history and exam documented by Dr. Wilson and I reviewed pertinent patient test results.  The assessment, diagnosis, and plan were formulated together and I agree with the documentation in the resident's note. 

## 2013-06-15 ENCOUNTER — Other Ambulatory Visit: Payer: Self-pay | Admitting: Internal Medicine

## 2013-06-15 DIAGNOSIS — D649 Anemia, unspecified: Secondary | ICD-10-CM

## 2013-07-16 ENCOUNTER — Ambulatory Visit: Payer: No Typology Code available for payment source

## 2013-10-21 NOTE — Addendum Note (Signed)
Addended by: Orson Gear on: 10/21/2013 03:43 PM   Modules accepted: Orders

## 2014-01-12 ENCOUNTER — Ambulatory Visit: Payer: No Typology Code available for payment source

## 2014-04-14 ENCOUNTER — Ambulatory Visit (INDEPENDENT_AMBULATORY_CARE_PROVIDER_SITE_OTHER): Payer: No Typology Code available for payment source | Admitting: Internal Medicine

## 2014-04-14 ENCOUNTER — Encounter: Payer: Self-pay | Admitting: Internal Medicine

## 2014-04-14 VITALS — BP 122/83 | HR 60 | Temp 98.3°F | Ht 67.0 in | Wt 148.7 lb

## 2014-04-14 DIAGNOSIS — R1319 Other dysphagia: Secondary | ICD-10-CM

## 2014-04-14 DIAGNOSIS — K219 Gastro-esophageal reflux disease without esophagitis: Secondary | ICD-10-CM

## 2014-04-14 MED ORDER — OMEPRAZOLE 20 MG PO CPDR: 20.0000 mg | DELAYED_RELEASE_CAPSULE | Freq: Every day | ORAL | Status: DC

## 2014-04-14 MED ORDER — LIDOCAINE VISCOUS 2 % MT SOLN: 20.0000 mL | OROMUCOSAL | Status: DC | PRN

## 2014-04-14 NOTE — Progress Notes (Signed)
   Subjective:    Patient ID: Megan Richardson, female    DOB: [birth year], 38 y.o.   MRN: [id number]  HPI Comments: 38 y.o PMH anxiety, asthma, right breast mass, constipation, HLD (05/2013 LDL 116), chronic palpitations and throat pain   She presents for: BP 122/83 HR 60  1. C/o dysphagia/odynophagia with burning sore throat.  This has been a problem x 3 years but recently she has noticed it is hard to swallow and it feels like a golf ball/lump is in her throat.  She went to ENT 3 years ago and they dx'ed her with GERD and she has been taking Ranitidine OTC qod.  Last night she tried her husbands Protonix ? Dose.  She c/o increased salivation and at times she feels like someone has a "choke hold on her throat" from the outside with swallowing.  Last night her throat was on fire and she tried to let cool air blow into her mouth which helped a little.  This am she tried to eat a McMuffin but it was painful to swallow.  She denies belching, heartburn.  She is concerned b/c she has 2 kids and FH lung cancer and throat cancer.     SH: 2 kids; occasionally smokes and drinks alcohol; enjoys spicy foods  HM: due for pap smear      Review of Systems  Constitutional: Negative for fever, chills and unexpected weight change.  HENT: Positive for sore throat and trouble swallowing.        Pain with swallowing   Gastrointestinal: Positive for constipation.       Chronic constipation       Objective:   Physical Exam  Nursing note and vitals reviewed. Constitutional: She is oriented to person, place, and time. She appears well-developed and well-nourished. No distress.  HENT:  Head: Normocephalic and atraumatic.  Mouth/Throat: Oropharynx is clear and moist.    No erythema/exudate   Cardiovascular: Normal rate, regular rhythm and normal heart sounds.   Pulmonary/Chest: Effort normal and breath sounds normal.  Neurological: She is alert and oriented to person, place, and time.  Skin: Skin is warm  and dry.  Psychiatric: She has a normal mood and affect. Her behavior is normal. Judgment and thought content normal.          Assessment & Plan:  F/u with GI

## 2014-04-14 NOTE — Assessment & Plan Note (Addendum)
Concerned for GERD refractory to H2blocker ,concerned for esophagitis (with complaints of dysphagia and odynophagia) Ed pt to avoid triggers  Will try PPI Priosec 20 mg qd take 30 min in am b/f breakfast x 8 weeks + Ranitidine bid x 3 days Will try Lidocaine vicous solution 2% 20 mL prn #100 mL with 1 refill  Will have pt f/u with [facility] for dysphagia and odynophagia and likely upper endoscopy

## 2014-04-14 NOTE — Patient Instructions (Addendum)
General Instructions: Take Prilosec 20 mg daily for 2 months (Take 30 minutes before breakfast) Take Raniditine 2x per day for 3 days then only continue Prilosec Use the Lidocaine solution for sore throat as needed.   Follow up with Gastroenterology -if you go here you will need to hold the Prilosec for 2 weeks if they plan on doing an upper endoscopy   Treatment Goals:  Goals (1 Years of Data) as of 04/14/14   None      Progress Toward Treatment Goals:  Treatment Goal 04/14/2014  Stop smoking smoking less    Self Care Goals & Plans:  Self Care Goal 04/14/2014  Manage my medications take my medicines as prescribed; bring my medications to every visit; refill my medications on time  Eat healthy foods drink diet soda or water instead of juice or soda; eat more vegetables; eat foods that are low in salt; eat baked foods instead of fried foods; eat fruit for snacks and desserts; eat smaller portions  Be physically active find an activity I enjoy  Meeting treatment goals maintain the current self-care plan    No flowsheet data found.   Care Management & Community Referrals:  Referral 04/14/2014  Referrals made to community resources none       Esophagitis Esophagitis is inflammation of the esophagus. It can involve swelling, soreness, and pain in the esophagus. This condition can make it difficult and painful to swallow. CAUSES  Most causes of esophagitis are not serious. Many different factors can cause esophagitis, including:  Gastroesophageal reflux disease (GERD). This is when acid from your stomach flows up into the esophagus.  Recurrent vomiting.  An allergic-type reaction.  Certain medicines, especially those that come in large pills.  Ingestion of harmful chemicals, such as household cleaning products.  Heavy alcohol use.  An infection of the esophagus.  Radiation treatment for cancer.  Certain diseases such as sarcoidosis, Crohn's disease, and scleroderma.  These diseases may cause recurrent esophagitis. SYMPTOMS   Trouble swallowing.  Painful swallowing.  Chest pain.  Difficulty breathing.  Nausea.  Vomiting.  Abdominal pain. DIAGNOSIS  Your caregiver will take your history and do a physical exam. Depending upon what your caregiver finds, certain tests may also be done, including:  Barium X-ray. You will drink a solution that coats the esophagus, and X-rays will be taken.  Endoscopy. A lighted tube is put down the esophagus so your caregiver can examine the area.  Allergy tests. These can sometimes be arranged through follow-up visits. TREATMENT  Treatment will depend on the cause of your esophagitis. In some cases, steroids or other medicines may be given to help relieve your symptoms or to treat the underlying cause of your condition. Medicines that may be recommended include:  Viscous lidocaine, to soothe the esophagus.  Antacids.  Acid reducers.  Proton pump inhibitors.  Antiviral medicines for certain viral infections of the esophagus.  Antifungal medicines for certain fungal infections of the esophagus.  Antibiotic medicines, depending on the cause of the esophagitis. HOME CARE INSTRUCTIONS   Avoid foods and drinks that seem to make your symptoms worse.  Eat small, frequent meals instead of large meals.  Avoid eating for the 3 hours prior to your bedtime.  If you have trouble taking pills, use a pill splitter to decrease the size and likelihood of the pill getting stuck or injuring the esophagus on the way down. Drinking water after taking a pill also helps.  Stop smoking if you smoke.  Maintain a  healthy weight.  Wear loose-fitting clothing. Do not wear anything tight around your waist that causes pressure on your stomach.  Raise the head of your bed 6 to 8 inches with wood blocks to help you sleep. Extra pillows will not help.  Only take over-the-counter or prescription medicines as directed by your  caregiver. SEEK IMMEDIATE MEDICAL CARE IF:  You have severe chest pain that radiates into your arm, neck, or jaw.  You feel sweaty, dizzy, or lightheaded.  You have shortness of breath.  You vomit blood.  You have difficulty or pain with swallowing.  You have bloody or black, tarry stools.  You have a fever.  You have a burning sensation in the chest more than 3 times a week for more than 2 weeks.  You cannot swallow, drink, or eat.  You drool because you cannot swallow your saliva. MAKE SURE YOU:  Understand these instructions.  Will watch your condition.  Will get help right away if you are not doing well or get worse. Document Released: 10/17/2004 Document Revised: 12/02/2011 Document Reviewed: 05/10/2011 [facility] Patient Information 2015 [place], [place]. This information is not intended to replace advice given to you by your health care provider. Make sure you discuss any questions you have with your health care provider.  Gastroesophageal Reflux Disease, Adult Gastroesophageal reflux disease (GERD) happens when acid from your stomach flows up into the esophagus. When acid comes in contact with the esophagus, the acid causes soreness (inflammation) in the esophagus. Over time, GERD may create small holes (ulcers) in the lining of the esophagus. CAUSES   Increased body weight. This puts pressure on the stomach, making acid rise from the stomach into the esophagus.  Smoking. This increases acid production in the stomach.  Drinking alcohol. This causes decreased pressure in the lower esophageal sphincter (valve or ring of muscle between the esophagus and stomach), allowing acid from the stomach into the esophagus.  Late evening meals and a full stomach. This increases pressure and acid production in the stomach.  A malformed lower esophageal sphincter. Sometimes, no cause is found. SYMPTOMS   Burning pain in the lower part of the mid-chest behind the breastbone and in  the mid-stomach area. This may occur twice a week or more often.  Trouble swallowing.  Sore throat.  Dry cough.  Asthma-like symptoms including chest tightness, shortness of breath, or wheezing. DIAGNOSIS  Your caregiver may be able to diagnose GERD based on your symptoms. In some cases, X-rays and other tests may be done to check for complications or to check the condition of your stomach and esophagus. TREATMENT  Your caregiver may recommend over-the-counter or prescription medicines to help decrease acid production. Ask your caregiver before starting or adding any new medicines.  HOME CARE INSTRUCTIONS   Change the factors that you can control. Ask your caregiver for guidance concerning weight loss, quitting smoking, and alcohol consumption.  Avoid foods and drinks that make your symptoms worse, such as:  Caffeine or alcoholic drinks.  Chocolate.  Peppermint or mint flavorings.  Garlic and onions.  Spicy foods.  Citrus fruits, such as oranges, lemons, or limes.  Tomato-based foods such as sauce, chili, salsa, and pizza.  Fried and fatty foods.  Avoid lying down for the 3 hours prior to your bedtime or prior to taking a nap.  Eat small, frequent meals instead of large meals.  Wear loose-fitting clothing. Do not wear anything tight around your waist that causes pressure on your stomach.  Raise the  head of your bed 6 to 8 inches with wood blocks to help you sleep. Extra pillows will not help.  Only take over-the-counter or prescription medicines for pain, discomfort, or fever as directed by your caregiver.  Do not take aspirin, ibuprofen, or other nonsteroidal anti-inflammatory drugs (NSAIDs). SEEK IMMEDIATE MEDICAL CARE IF:   You have pain in your arms, neck, jaw, teeth, or back.  Your pain increases or changes in intensity or duration.  You develop nausea, vomiting, or sweating (diaphoresis).  You develop shortness of breath, or you faint.  Your vomit is  green, yellow, black, or looks like coffee grounds or blood.  Your stool is red, bloody, or black. These symptoms could be signs of other problems, such as heart disease, gastric bleeding, or esophageal bleeding. MAKE SURE YOU:   Understand these instructions.  Will watch your condition.  Will get help right away if you are not doing well or get worse. Document Released: 06/19/2005 Document Revised: 12/02/2011 Document Reviewed: 03/29/2011 [facility] Patient Information 2015 [place], [place]. This information is not intended to replace advice given to you by your health care provider. Make sure you discuss any questions you have with your health care provider.

## 2014-04-18 NOTE — Progress Notes (Signed)
Case discussed with Dr. McLean at the time of the visit.  We reviewed the resident's history and exam and pertinent patient test results.  I agree with the assessment, diagnosis, and plan of care documented in the resident's note.     

## 2014-04-19 ENCOUNTER — Telehealth: Payer: Self-pay | Admitting: Internal Medicine

## 2014-04-19 NOTE — Telephone Encounter (Signed)
Spoke with patient and she saw the outpatient clinic this week due to burning in throat and dysphagia. They wanted her to see her GI for further evaluation. Scheduled with Tye Savoy, NP on 04/25/14 at 2:30 PM.

## 2014-04-25 ENCOUNTER — Encounter: Payer: Self-pay | Admitting: Nurse Practitioner

## 2014-04-25 ENCOUNTER — Ambulatory Visit (INDEPENDENT_AMBULATORY_CARE_PROVIDER_SITE_OTHER): Payer: No Typology Code available for payment source | Admitting: Nurse Practitioner

## 2014-04-25 VITALS — BP 112/74 | HR 72 | Ht 67.75 in | Wt 150.0 lb

## 2014-04-25 DIAGNOSIS — R0989 Other specified symptoms and signs involving the circulatory and respiratory systems: Secondary | ICD-10-CM | POA: Insufficient documentation

## 2014-04-25 DIAGNOSIS — R09A2 Foreign body sensation, throat: Secondary | ICD-10-CM | POA: Insufficient documentation

## 2014-04-25 DIAGNOSIS — R198 Other specified symptoms and signs involving the digestive system and abdomen: Secondary | ICD-10-CM

## 2014-04-25 DIAGNOSIS — F458 Other somatoform disorders: Secondary | ICD-10-CM

## 2014-04-25 NOTE — Progress Notes (Signed)
HPI :  Patient is a 38 year old female known to Dr. Olevia Perches. She had a colonoscopy in 2008 for evaluation of constipation Colon was redundant but otherwise normal.  Patient comes in today for evaluation abnormal sensation in throat.Marland Kitchen Approximately 3 years ago patient was evaluated by ENT for abnormal sensation in her throat. Apparently nothing found though it does not sound like she had a laryngoscopy. The sensation has persisted and she frequent sensation of pressure over front of neck like someone is squeezing it. The symptoms have progressed over the last month. A few weeks ago patient developed severe burning in her throat followed by odynophagia. She was seen by PCP and given lidocaine, Prilosec, and GI referral . Since that time patient has been on daily omeprazole without any improvement in symptoms. Other than recent episode of burning patient denies any chronic heartburn problems. No dysphasia.  Patient also endorses chronic constipation. MiraLax is too expensive, suppositories do not work. She ieats plenty of fruits and vegetables. Typically a Dulcolax at bedtime produces a normal bowel movement in the morning though  over the last couple of weeks Dulcolax hasn't been as effective. Prunes do work.  Past Medical History  Diagnosis Date  . Asthma     With Exacerbations  . Constipation     SP normal colonoscopy 7/08, GI lebaur Dr. Elicia Lamp.  . Anxiety   . Hepatitis B immune   . Palpitations     Chronic  . GERD (gastroesophageal reflux disease)    Family History  Problem Relation Age of Onset  . BRCA 1/2 Maternal Grandmother   . Cancer Maternal Aunt     thoat   . Cancer Maternal Grandfather     lung   History  Substance Use Topics  . Smoking status: Current Some Day Smoker -- 0.10 packs/day    Types: Cigarettes  . Smokeless tobacco: Never Used     Comment: has cut down.  . Alcohol Use: Yes     Comment: Only socially.    Current Outpatient Prescriptions  Medication  Sig Dispense Refill  . albuterol (VENTOLIN HFA) 108 (90 BASE) MCG/ACT inhaler Inhale 2 puffs into the lungs every 6 (six) hours as needed.  3 Inhaler  11  . Bisacodyl (DULCOLAX PO) Take by mouth.      . Fluticasone-Salmeterol (ADVAIR DISKUS) 250-50 MCG/DOSE AEPB Inhale 1 puff into the lungs every 12 (twelve) hours.  60 each  11  . lidocaine (XYLOCAINE) 2 % solution Use as directed 20 mLs in the mouth or throat as needed for mouth pain.  100 mL  1  . omeprazole (PRILOSEC) 20 MG capsule Take 1 capsule (20 mg total) by mouth daily.  30 capsule  1  . RANITIDINE HCL PO Take by mouth.       No current facility-administered medications for this visit.   No Known Allergies   Review of Systems: All systems reviewed and negative except where noted in HPI.   Physical Exam: BP 112/74  Pulse 72  Ht 5' 7.75" (1.721 m)  Wt 150 lb (68.04 kg)  BMI 22.97 kg/m2  LMP 04/19/2014 Constitutional: Pleasant,well-developed, female in no acute distress. HEENT: Normocephalic and atraumatic. Conjunctivae are normal. No scleral icterus. Neck supple.  Cardiovascular: Normal rate, regular rhythm.  Pulmonary/chest: Effort normal and breath sounds normal. No wheezing, rales or rhonchi. Abdominal: Soft, nondistended, nontender. Bowel sounds active throughout. There are no masses palpable. No hepatomegaly. Extremities: no edema Lymphadenopathy: No cervical adenopathy noted. Neurological: Alert and  oriented to person place and time. Skin: Skin is warm and dry. No rashes noted. Psychiatric: Normal mood and affect. Behavior is normal.   ASSESSMENT AND PLAN:  23. 38 year old female with a three-year history of GERD/esophageal symptoms. Symptoms worse over the last month. Patient describes sensation of something in back of throat not visible on exam or by ENT.  She has squeezing sensation in her neck. Patient may be describing globus but symptoms are a little atypical. She does not have pyrosis or dysphagia. For further  evaluation patient will be scheduled for an upper endoscopy. In the meantime I did encourage her to continue daily PPI because if this is globus it may respond.The benefits, risks, and potential complications of EGD with possible biopsies were discussed with the patient and she agrees to proceed.   2. Chronic constipation. Redundant colon on colonoscopy in 2008 done for constipation .  Suppositories don't help and MiraLax too expensive. Dulcolax usually works but not as well lately. Prune juice works, she will try that.

## 2014-04-25 NOTE — Patient Instructions (Signed)

## 2014-04-26 ENCOUNTER — Encounter: Payer: Self-pay | Admitting: Nurse Practitioner

## 2014-04-27 NOTE — Progress Notes (Signed)
reviewed and agree, ? Sitz mark study to assess for colonic inertia,

## 2014-05-11 ENCOUNTER — Encounter: Payer: Self-pay | Admitting: Internal Medicine

## 2014-05-11 ENCOUNTER — Ambulatory Visit (AMBULATORY_SURGERY_CENTER): Payer: Self-pay | Admitting: Internal Medicine

## 2014-05-11 VITALS — BP 119/79 | HR 61 | Temp 97.8°F | Resp 19 | Ht 67.75 in | Wt 150.0 lb

## 2014-05-11 DIAGNOSIS — F458 Other somatoform disorders: Secondary | ICD-10-CM

## 2014-05-11 DIAGNOSIS — R198 Other specified symptoms and signs involving the digestive system and abdomen: Secondary | ICD-10-CM

## 2014-05-11 DIAGNOSIS — R0989 Other specified symptoms and signs involving the circulatory and respiratory systems: Secondary | ICD-10-CM

## 2014-05-11 MED ORDER — SODIUM CHLORIDE 0.9 % IV SOLN: 500.0000 mL | INTRAVENOUS | Status: DC

## 2014-05-11 MED ORDER — DIAZEPAM 2 MG PO TABS: 2.0000 mg | ORAL_TABLET | Freq: Four times a day (QID) | ORAL | Status: DC | PRN

## 2014-05-11 MED ORDER — METHYLPREDNISOLONE (PAK) 4 MG PO TABS: ORAL_TABLET | ORAL | Status: DC

## 2014-05-11 NOTE — Op Note (Signed)
[facility]  [address] [place], [id number]   ENDOSCOPY PROCEDURE REPORT  PATIENT: Megan, Richardson  MR#: [id number] BIRTHDATE: 1976/05/23 , 38  yrs. old GENDER: Female ENDOSCOPIST: Lafayette Dragon, MD REFERRED BY:  Dr Duwaine Maxin PROCEDURE DATE:  05/11/2014 PROCEDURE:  EGD, diagnostic ASA CLASS:     Class I INDICATIONS:  globus sensation for past 3 years.  ENT exam was normal.  Proton pump inhibitors do not seem to relieve the symptoms.Marland Kitchen MEDICATIONS: MAC sedation, administered by CRNA and propofol (Diprivan) 200mg  IV TOPICAL ANESTHETIC: none  DESCRIPTION OF PROCEDURE: After the risks benefits and alternatives of the procedure were thoroughly explained, informed consent was obtained.  The [facility] [id number] endoscope was introduced through the mouth and advanced to the second portion of the duodenum. Without limitations.  The instrument was slowly withdrawn as the mucosa was fully examined.      Esophagus: posterior pharynx was normal.base of the common showed normal appearing papillae The valleculae were  slightly edematous, one was slightly larger than the other. Upper esophageal sphincter relaxed as the endoscope passed through. Esophageal mucosa in proximal mid and distal esophagus was unremarkable. The Z line was normal. There was no stricture or viable hernia[  Stomach: gastric mucosa were normal throughout the body of the stomach and gastric antrum. Pyloric outlet was normal. Retroflexion of the endoscope confirmed normal fundus and cardia  Duodenum: duodenal bulb and descending duodenum was normal The scope was then withdrawn from the patient and the procedure completed.  COMPLICATIONS: There were no complications. ENDOSCOPIC IMPRESSION:  essentially normal upper endoscopy of esophagus stomach and duodenum nothing to account for patient's symptoms of globus, which may be related to postnasal drip. allergies or  spasm. She may  need further evaluation including TSH RECOMMENDATIONS:  trial of Medrol pack Trial of diazepam 2.5-5 mg when necessary globus sensation Smoking cessation   REPEAT EXAM: no  eSigned:  Lafayette Dragon, MD 05/11/2014 9:37 AM   CC:  PATIENT NAME:  Megan, Richardson MR#: [id number]

## 2014-05-11 NOTE — Progress Notes (Signed)
Report to PACU, RN, vss, BBS= Clear.  

## 2014-05-11 NOTE — Patient Instructions (Signed)
YOU HAD AN ENDOSCOPIC PROCEDURE TODAY AT THE [facility]: Refer to the procedure report that was given to you for any specific questions about what was found during the examination.  If the procedure report does not answer your questions, please call your gastroenterologist to clarify.  If you requested that your care partner not be given the details of your procedure findings, then the procedure report has been included in a sealed envelope for you to review at your convenience later.  YOU SHOULD EXPECT: Some feelings of bloating in the abdomen. Passage of more gas than usual.  Walking can help get rid of the air that was put into your GI tract during the procedure and reduce the bloating. If you had a lower endoscopy (such as a colonoscopy or flexible sigmoidoscopy) you may notice spotting of blood in your stool or on the toilet paper. If you underwent a bowel prep for your procedure, then you may not have a normal bowel movement for a few days.  DIET: Your first meal following the procedure should be a light meal and then it is ok to progress to your normal diet.  A half-sandwich or bowl of soup is an example of a good first meal.  Heavy or fried foods are harder to digest and may make you feel nauseous or bloated.  Likewise meals heavy in dairy and vegetables can cause extra gas to form and this can also increase the bloating.  Drink plenty of fluids but you should avoid alcoholic beverages for 24 hours.  ACTIVITY: Your care partner should take you home directly after the procedure.  You should plan to take it easy, moving slowly for the rest of the day.  You can resume normal activity the day after the procedure however you should NOT DRIVE or use heavy machinery for 24 hours (because of the sedation medicines used during the test).    SYMPTOMS TO REPORT IMMEDIATELY: A gastroenterologist can be reached at any hour.  During normal business hours, 8:30 AM to 5:00 PM Monday through Friday,  call [phone].  After hours and on weekends, please call the GI answering service at [phone] who will take a message and have the physician on call contact you.   Following upper endoscopy (EGD)  Vomiting of blood or coffee ground material  New chest pain or pain under the shoulder blades  Painful or persistently difficult swallowing  New shortness of breath  Fever of 100F or higher  Black, tarry-looking stools  FOLLOW UP: If any biopsies were taken you will be contacted by phone or by letter within the next 1-3 weeks.  Call your gastroenterologist if you have not heard about the biopsies in 3 weeks.  Our staff will call the home number listed on your records the next business day following your procedure to check on you and address any questions or concerns that you may have at that time regarding the information given to you following your procedure. This is a courtesy call and so if there is no answer at the home number and we have not heard from you through the emergency physician on call, we will assume that you have returned to your regular daily activities without incident.  SIGNATURES/CONFIDENTIALITY: You and/or your care partner have signed paperwork which will be entered into your electronic medical record.  These signatures attest to the fact that that the information above on your After Visit Summary has been reviewed and is understood.  Full responsibility   of the confidentiality of this discharge information lies with you and/or your care-partner.  Resume medications. 

## 2014-05-12 ENCOUNTER — Telehealth: Payer: Self-pay

## 2014-05-12 NOTE — Telephone Encounter (Signed)
  Follow up Call-  Call back number 05/11/2014  Post procedure Call Back phone  # [phone]  Permission to leave phone message Yes     Patient questions:  Do you have a fever, pain , or abdominal swelling? No. Pain Score  0 *  Have you tolerated food without any problems? Yes.    Have you been able to return to your normal activities? Yes.    Do you have any questions about your discharge instructions: Diet   No. Medications  No. Follow up visit  No.  Do you have questions or concerns about your Care? No.  Actions: * If pain score is 4 or above: No action needed, pain <4.

## 2014-06-06 ENCOUNTER — Other Ambulatory Visit: Payer: Self-pay | Admitting: *Deleted

## 2014-06-06 DIAGNOSIS — J45909 Unspecified asthma, uncomplicated: Secondary | ICD-10-CM

## 2014-06-08 MED ORDER — FLUTICASONE-SALMETEROL 250-50 MCG/DOSE IN AEPB: 1.0000 | INHALATION_SPRAY | Freq: Two times a day (BID) | RESPIRATORY_TRACT | Status: DC

## 2014-06-09 NOTE — Telephone Encounter (Signed)
Rx called in to pharmacy. 

## 2014-06-14 ENCOUNTER — Other Ambulatory Visit: Payer: Self-pay | Admitting: *Deleted

## 2014-06-14 DIAGNOSIS — J45909 Unspecified asthma, uncomplicated: Secondary | ICD-10-CM

## 2014-06-14 MED ORDER — ALBUTEROL SULFATE HFA 108 (90 BASE) MCG/ACT IN AERS: 2.0000 | INHALATION_SPRAY | Freq: Four times a day (QID) | RESPIRATORY_TRACT | Status: DC | PRN

## 2014-06-15 NOTE — Telephone Encounter (Signed)
Rx called in to pharmacy. Hilda Blades Braxston Quinter RN 06/15/14 10:30AM

## 2014-06-22 ENCOUNTER — Telehealth: Payer: Self-pay | Admitting: Internal Medicine

## 2014-06-22 NOTE — Telephone Encounter (Signed)
Spoke with patient and she states Dr. Olevia Perches thought anxiety was causing her "choking feeling." States she was given Valium 2 mg to try. States when she takes 3 tablets it seems to help. She is out of Valium. She is asking for a new rx or a medication for anxiety. Please, advise.

## 2014-06-22 NOTE — Telephone Encounter (Signed)
OK to give Valium 5 mg, #30, 1 po q 8 hrs prn anxiety, not to exceed 3/day. Last refill.

## 2014-06-23 MED ORDER — DIAZEPAM 5 MG PO TABS: 5.0000 mg | ORAL_TABLET | Freq: Three times a day (TID) | ORAL | Status: DC | PRN

## 2014-06-23 NOTE — Telephone Encounter (Signed)
Spoke with pt and she is aware, script called to pharmacy.

## 2014-07-13 ENCOUNTER — Ambulatory Visit: Payer: No Typology Code available for payment source

## 2014-07-13 ENCOUNTER — Ambulatory Visit (INDEPENDENT_AMBULATORY_CARE_PROVIDER_SITE_OTHER): Payer: Self-pay | Admitting: *Deleted

## 2014-07-13 DIAGNOSIS — Z23 Encounter for immunization: Secondary | ICD-10-CM

## 2014-07-20 ENCOUNTER — Ambulatory Visit: Payer: Self-pay

## 2014-10-25 ENCOUNTER — Telehealth: Payer: Self-pay | Admitting: Internal Medicine

## 2014-10-25 NOTE — Telephone Encounter (Signed)
Call to patient to confirm appointment for 10/26/14 at 3:45. LMTCB

## 2014-10-25 NOTE — Telephone Encounter (Signed)
Patient Confirmed

## 2014-10-26 ENCOUNTER — Encounter: Payer: Self-pay | Admitting: Internal Medicine

## 2014-11-09 ENCOUNTER — Encounter: Payer: Self-pay | Admitting: Internal Medicine

## 2014-11-15 ENCOUNTER — Telehealth: Payer: Self-pay | Admitting: Internal Medicine

## 2014-11-15 NOTE — Telephone Encounter (Signed)
Call to patient to confirm appointment for 11/16/14 at 3:45 lmtcb

## 2014-11-16 ENCOUNTER — Ambulatory Visit (INDEPENDENT_AMBULATORY_CARE_PROVIDER_SITE_OTHER): Payer: Self-pay | Admitting: Internal Medicine

## 2014-11-16 ENCOUNTER — Encounter: Payer: Self-pay | Admitting: Internal Medicine

## 2014-11-16 VITALS — BP 108/75 | HR 70 | Temp 98.0°F | Ht 67.0 in | Wt 143.4 lb

## 2014-11-16 DIAGNOSIS — Z719 Counseling, unspecified: Secondary | ICD-10-CM | POA: Insufficient documentation

## 2014-11-16 DIAGNOSIS — Z1231 Encounter for screening mammogram for malignant neoplasm of breast: Secondary | ICD-10-CM

## 2014-11-16 DIAGNOSIS — N898 Other specified noninflammatory disorders of vagina: Secondary | ICD-10-CM

## 2014-11-16 DIAGNOSIS — R300 Dysuria: Secondary | ICD-10-CM

## 2014-11-16 DIAGNOSIS — Z0000 Encounter for general adult medical examination without abnormal findings: Secondary | ICD-10-CM | POA: Insufficient documentation

## 2014-11-16 DIAGNOSIS — Z01419 Encounter for gynecological examination (general) (routine) without abnormal findings: Secondary | ICD-10-CM

## 2014-11-16 DIAGNOSIS — Z124 Encounter for screening for malignant neoplasm of cervix: Secondary | ICD-10-CM

## 2014-11-16 DIAGNOSIS — N390 Urinary tract infection, site not specified: Secondary | ICD-10-CM | POA: Insufficient documentation

## 2014-11-16 NOTE — Assessment & Plan Note (Addendum)
She reports vaginal discharge and an odor that began after an unprotected sexual encounter 2 weeks ago.  She denies abnormal bleeding, dyspareunia or vaginal lesions.  She requests STD testing including HIV.  There is a moderate amount of white discharge present on vaginal exam.  This may be candida. - PAP smear, GC/CT, trich/BV/candida - HIV ab w/ reflex confirmatory - she will return to clinic on Monday to review results

## 2014-11-16 NOTE — Assessment & Plan Note (Signed)
Hx of abnormal PAP back in her 8s treated with cryotherapy.  She reports normal PAPs since then.  Last one in [facility] was normal in 2010.    - PAP smear w/ HPV co-testing today

## 2014-11-16 NOTE — Patient Instructions (Signed)
1. Please return to clinic on Monday to review your results.   I will refer you back to [facility] for your mammogram.  Please make an appointment to see me for routine follow-up in about 6 months.    2. Please take all medications as prescribed.     3. If you have worsening of your symptoms or new symptoms arise, please call the clinic ([phone]), or go to the ER immediately if symptoms are severe.

## 2014-11-16 NOTE — Assessment & Plan Note (Signed)
Mammo - performed in 2012 due to concern for right breast mass; negative in 2012.  She will turn 39 next month and is agreeable to returning to the [facility] for mammo.  Will refer today. PAP - completed today HIV screening - completed today

## 2014-11-16 NOTE — Progress Notes (Signed)
   Subjective:    Patient ID: Roselee Culver, female    DOB: 08/15/1976, 39 y.o.   MRN: [id number]  HPI Comments: Ms. Ellerson is a 39 year old woman with PMH of asthma, chronic constipation and anxiety here for PAP smear.  Please see problem based charting for updates.     Review of Systems  Constitutional: Positive for appetite change.       Decreased appetite (she feels is related to stress since her husband passed away 9 months ago).  Respiratory: Negative for shortness of breath.   Cardiovascular: Negative for chest pain.  Gastrointestinal: Negative for nausea, vomiting, abdominal pain, diarrhea, constipation, blood in stool and rectal pain.  Genitourinary: Positive for dysuria and vaginal discharge. Negative for frequency, hematuria, vaginal bleeding, vaginal pain and dyspareunia.       + vaginal odor and discharge after recent unprotected sexual encounter       Objective:   Physical Exam  Constitutional: She is oriented to person, place, and time. She appears well-developed and well-nourished. No distress.  HENT:  Head: Normocephalic and atraumatic.  Neck: Neck supple.  Genitourinary: No labial fusion. There is no rash, tenderness, lesion or injury on the right labia. There is no rash, tenderness, lesion or injury on the left labia. Cervix exhibits discharge. Cervix exhibits no motion tenderness and no friability. Right adnexum displays no mass, no tenderness and no fullness. Left adnexum displays no mass, no tenderness and no fullness. No erythema, tenderness or bleeding in the vagina. No signs of injury around the vagina. Vaginal discharge found.  + white discharge   Musculoskeletal: Normal range of motion.  Neurological: She is alert and oriented to person, place, and time.  Skin: Skin is warm. She is not diaphoretic.  Psychiatric: She has a normal mood and affect. Her behavior is normal.  Vitals reviewed.         Assessment & Plan:  Please see problem based assessment  and plan.

## 2014-11-16 NOTE — Assessment & Plan Note (Signed)
Dysuria is likely related to vaginal symptoms but will check a UA to r/o UTI.

## 2014-11-17 LAB — URINALYSIS, ROUTINE W REFLEX MICROSCOPIC
Bilirubin Urine: NEGATIVE
Glucose, UA: NEGATIVE mg/dL
Hgb urine dipstick: NEGATIVE
KETONES UR: NEGATIVE mg/dL
Leukocytes, UA: NEGATIVE
NITRITE: NEGATIVE
Protein, ur: NEGATIVE mg/dL
SPECIFIC GRAVITY, URINE: 1.02 (ref 1.005–1.030)
UROBILINOGEN UA: 0.2 mg/dL (ref 0.0–1.0)
pH: 5.5 (ref 5.0–8.0)

## 2014-11-17 LAB — CERVICOVAGINAL ANCILLARY ONLY
Chlamydia: NEGATIVE
Neisseria Gonorrhea: NEGATIVE

## 2014-11-17 LAB — HIV ANTIBODY (ROUTINE TESTING W REFLEX): HIV 1&2 Ab, 4th Generation: NONREACTIVE

## 2014-11-17 NOTE — Progress Notes (Signed)
Internal Medicine Clinic Attending  Case discussed with Dr. Wilson soon after the resident saw the patient.  We reviewed the resident's history and exam and pertinent patient test results.  I agree with the assessment, diagnosis, and plan of care documented in the resident's note.  

## 2014-11-18 ENCOUNTER — Telehealth: Payer: Self-pay | Admitting: *Deleted

## 2014-11-18 ENCOUNTER — Telehealth: Payer: Self-pay | Admitting: Internal Medicine

## 2014-11-18 ENCOUNTER — Other Ambulatory Visit: Payer: Self-pay | Admitting: Internal Medicine

## 2014-11-18 DIAGNOSIS — B9689 Other specified bacterial agents as the cause of diseases classified elsewhere: Secondary | ICD-10-CM

## 2014-11-18 DIAGNOSIS — N760 Acute vaginitis: Secondary | ICD-10-CM

## 2014-11-18 LAB — CYTOLOGY - PAP

## 2014-11-18 LAB — CERVICOVAGINAL ANCILLARY ONLY
Wet Prep (BD Affirm): NEGATIVE
Wet Prep (BD Affirm): NEGATIVE
Wet Prep (BD Affirm): POSITIVE — AB

## 2014-11-18 MED ORDER — METRONIDAZOLE 500 MG PO TABS: 500.0000 mg | ORAL_TABLET | Freq: Two times a day (BID) | ORAL | Status: DC

## 2014-11-18 NOTE — Telephone Encounter (Signed)
[name] saw a missed call from clinic so she decided to come to clinic.  RN in clinic paged me and I spoke with Megan Richardson.  I informed her that all test results had returned except PAP results.  I informed her that she was positive for BV and rx was sent for Flagyl.  I told her not to drink while taking Flagyl because it would make her sick and she agrees and says she does not drink.  I explained BV and informed her that other test were negative.  I asked if she would like a phone call or letter when PAP results returned and she says a letter is fine.

## 2014-11-18 NOTE — Telephone Encounter (Signed)
Pt presented to clinic after hours wanting results due to a missed call, found med had been called in, paged dr Redmond Pulling to speak w/ pt and pt was informed via ph by dr Redmond Pulling, pt understands to pick up med and not to consume alcohol due to adverse effects

## 2014-11-18 NOTE — Telephone Encounter (Signed)
I attempted to call Megan Richardson to inform her of wet prep results and let her know I was sending Flagyl to her pharmacy.  There was no answer so I will attempt to call again.

## 2014-11-20 ENCOUNTER — Other Ambulatory Visit: Payer: Self-pay | Admitting: Internal Medicine

## 2014-11-20 ENCOUNTER — Telehealth: Payer: Self-pay | Admitting: Internal Medicine

## 2014-11-20 DIAGNOSIS — IMO0002 Reserved for concepts with insufficient information to code with codable children: Secondary | ICD-10-CM

## 2014-11-20 NOTE — Telephone Encounter (Signed)
Megan Richardson was notified of her PAP results.  HPV was detected so I will add on high risk 16/18 testing. Patient voiced understanding.

## 2014-11-23 ENCOUNTER — Telehealth: Payer: Self-pay | Admitting: Internal Medicine

## 2014-11-23 ENCOUNTER — Encounter: Payer: Self-pay | Admitting: Internal Medicine

## 2014-11-23 NOTE — Telephone Encounter (Signed)
I called Megan Richardson to inform her that high risk HPV not detected and will plan to repeat co-testing in 1 year.  She voiced understanding and is in agreement with the plan.

## 2014-11-25 NOTE — Addendum Note (Signed)
Addended by: Orson Gear on: 11/25/2014 09:41 AM   Modules accepted: Orders

## 2014-12-01 ENCOUNTER — Encounter: Payer: Self-pay | Admitting: *Deleted

## 2014-12-14 ENCOUNTER — Other Ambulatory Visit: Payer: Self-pay | Admitting: *Deleted

## 2014-12-14 DIAGNOSIS — J4530 Mild persistent asthma, uncomplicated: Secondary | ICD-10-CM

## 2014-12-14 MED ORDER — FLUTICASONE-SALMETEROL 250-50 MCG/DOSE IN AEPB: 1.0000 | INHALATION_SPRAY | Freq: Two times a day (BID) | RESPIRATORY_TRACT | Status: DC

## 2014-12-14 NOTE — Telephone Encounter (Signed)
Review of the chart suggests that [name]' asthma has been well controlled on the current dose of Advair.  I refilled only 1 month as I would like her PCP to assess control of asthma, and if appropriate, consider stepping down on the dose of the medication and she may no longer require this high of a dose of Advair to maintain excellent control of her asthma.

## 2014-12-23 ENCOUNTER — Telehealth: Payer: Self-pay | Admitting: *Deleted

## 2014-12-23 ENCOUNTER — Emergency Department (HOSPITAL_COMMUNITY)
Admission: EM | Admit: 2014-12-23 | Discharge: 2014-12-23 | Discharge status: Against Medical Advice | Payer: Self-pay | Attending: Emergency Medicine | Admitting: Emergency Medicine

## 2014-12-23 ENCOUNTER — Encounter (HOSPITAL_COMMUNITY): Payer: Self-pay

## 2014-12-23 ENCOUNTER — Emergency Department (HOSPITAL_COMMUNITY): Payer: Self-pay

## 2014-12-23 DIAGNOSIS — Z8659 Personal history of other mental and behavioral disorders: Secondary | ICD-10-CM | POA: Insufficient documentation

## 2014-12-23 DIAGNOSIS — H53149 Visual discomfort, unspecified: Secondary | ICD-10-CM | POA: Insufficient documentation

## 2014-12-23 DIAGNOSIS — J45909 Unspecified asthma, uncomplicated: Secondary | ICD-10-CM | POA: Insufficient documentation

## 2014-12-23 DIAGNOSIS — K5900 Constipation, unspecified: Secondary | ICD-10-CM | POA: Insufficient documentation

## 2014-12-23 DIAGNOSIS — R519 Headache, unspecified: Secondary | ICD-10-CM

## 2014-12-23 DIAGNOSIS — Z8619 Personal history of other infectious and parasitic diseases: Secondary | ICD-10-CM | POA: Insufficient documentation

## 2014-12-23 DIAGNOSIS — M542 Cervicalgia: Secondary | ICD-10-CM | POA: Insufficient documentation

## 2014-12-23 DIAGNOSIS — Z792 Long term (current) use of antibiotics: Secondary | ICD-10-CM | POA: Insufficient documentation

## 2014-12-23 DIAGNOSIS — Z79899 Other long term (current) drug therapy: Secondary | ICD-10-CM | POA: Insufficient documentation

## 2014-12-23 DIAGNOSIS — Z720 Tobacco use: Secondary | ICD-10-CM | POA: Insufficient documentation

## 2014-12-23 DIAGNOSIS — R51 Headache: Secondary | ICD-10-CM | POA: Insufficient documentation

## 2014-12-23 MED ORDER — DEXAMETHASONE SODIUM PHOSPHATE 10 MG/ML IJ SOLN
10.0000 mg | Freq: Once | INTRAMUSCULAR | Status: AC
  Administered 2014-12-23: 10 mg via INTRAVENOUS
  Filled 2014-12-23: qty 1

## 2014-12-23 MED ORDER — METOCLOPRAMIDE HCL 5 MG/ML IJ SOLN
10.0000 mg | Freq: Once | INTRAMUSCULAR | Status: AC
  Administered 2014-12-23: 10 mg via INTRAVENOUS
  Filled 2014-12-23: qty 2

## 2014-12-23 MED ORDER — DIPHENHYDRAMINE HCL 50 MG/ML IJ SOLN
25.0000 mg | Freq: Once | INTRAMUSCULAR | Status: AC
  Administered 2014-12-23: 25 mg via INTRAVENOUS
  Filled 2014-12-23: qty 1

## 2014-12-23 NOTE — ED Provider Notes (Addendum)
CSN: [id number]     Arrival date & time 12/23/14  1011 History   First MD Initiated Contact with Patient 12/23/14 1037     Chief Complaint  Patient presents with  . Headache     (Consider location/radiation/quality/duration/timing/severity/associated sxs/prior Treatment) Patient is a 39 y.o. female presenting with headaches. The history is provided by the patient.  Headache Pain location:  Frontal Quality:  Sharp and dull Radiates to:  Does not radiate Severity currently:  10/10 Severity at highest:  5/10 Onset quality:  Sudden Duration:  5 days Timing:  Constant Progression:  Waxing and waning Chronicity:  New Similar to prior headaches: no   Context comment:  Patient states on Monday she had a popping sensation in the front part of her head with severe onset of headache which cause nausea, photophobia, dizziness and sweating that lasted for approximately 1-2 hours and then improved to only a dull headache Relieved by: Improved with NSAIDs but not resolved. Exacerbated by: Any activity that causes strain or increased pressure to her head. Bending her head over or attempting to have a bowel movement. Associated symptoms: neck pain and photophobia   Associated symptoms: no abdominal pain, no back pain, no blurred vision, no congestion, no cough, no dizziness, no focal weakness, no paresthesias, no syncope and no vomiting   Associated symptoms comment:  Mild intermittent right sided neck tenderness since this started Risk factors comment:  No hx of headaches   Past Medical History  Diagnosis Date  . Asthma     With Exacerbations  . Constipation     SP normal colonoscopy 7/08, GI lebaur Dr. Elicia Lamp.  . Anxiety   . Hepatitis B immune   . Palpitations     Chronic  . GERD (gastroesophageal reflux disease)    Past Surgical History  Procedure Laterality Date  . Tubal ligation  2006  . Cesarean section      Multiple   Family History  Problem Relation Age of Onset  . BRCA  1/2 Maternal Grandmother   . Cancer Maternal Aunt     thoat   . Cancer Maternal Grandfather     lung   History  Substance Use Topics  . Smoking status: Current Some Day Smoker -- 0.10 packs/day    Types: Cigarettes  . Smokeless tobacco: Never Used     Comment: has cut down.  . Alcohol Use: Yes     Comment: Only socially.    OB History    No data available     Review of Systems  HENT: Negative for congestion.   Eyes: Positive for photophobia. Negative for blurred vision.  Respiratory: Negative for cough.   Cardiovascular: Negative for syncope.  Gastrointestinal: Negative for vomiting and abdominal pain.  Musculoskeletal: Positive for neck pain. Negative for back pain.  Neurological: Positive for headaches. Negative for dizziness, focal weakness and paresthesias.  All other systems reviewed and are negative.     Allergies  Review of patient's allergies indicates no known allergies.  Home Medications   Prior to Admission medications   Medication Sig Start Date End Date Taking? Authorizing Provider  albuterol (VENTOLIN HFA) 108 (90 BASE) MCG/ACT inhaler Inhale 2 puffs into the lungs every 6 (six) hours as needed. 06/14/14   Nischal Narendra, MD  bisacodyl (DULCOLAX) 5 MG EC tablet Take 10 mg by mouth daily as needed for moderate constipation.    Historical Provider, MD  Fluticasone-Salmeterol (ADVAIR DISKUS) 250-50 MCG/DOSE AEPB Inhale 1 puff into the lungs every  12 (twelve) hours. 12/14/14   Oval Linsey, MD  metroNIDAZOLE (FLAGYL) 500 MG tablet Take 1 tablet (500 mg total) by mouth 2 (two) times daily. 11/18/14   Francesca Oman, DO   BP 137/87 mmHg  Pulse 66  Temp(Src) 97.9 F (36.6 C) (Oral)  Resp 17  Ht '5\' 7"'  (1.702 m)  Wt 140 lb (63.504 kg)  BMI 21.92 kg/m2  SpO2 98%  LMP 12/12/2014 Physical Exam  Constitutional: She is oriented to person, place, and time. She appears well-developed and well-nourished. No distress.  HENT:  Head: Normocephalic and atraumatic.   Right Ear: Tympanic membrane normal.  Left Ear: Tympanic membrane normal.  Nose: Nose normal.  Mouth/Throat: Oropharynx is clear and moist.  Eyes: Conjunctivae and EOM are normal. Pupils are equal, round, and reactive to light.  No photophobia  Neck: Normal range of motion. Neck supple.  Cardiovascular: Normal rate, regular rhythm and intact distal pulses.   No murmur heard. Pulmonary/Chest: Effort normal and breath sounds normal. No respiratory distress. She has no wheezes. She has no rales.  Abdominal: Soft. She exhibits no distension. There is no tenderness. There is no rebound and no guarding.  Musculoskeletal: Normal range of motion. She exhibits no edema or tenderness.  Neurological: She is alert and oriented to person, place, and time. She has normal strength. No cranial nerve deficit or sensory deficit. Coordination and gait normal.  Skin: Skin is warm and dry. No rash noted. No erythema.  Psychiatric: She has a normal mood and affect. Her behavior is normal.  Nursing note and vitals reviewed.   ED Course  Procedures (including critical care time) Labs Review Labs Reviewed - No data to display  Imaging Review Ct Head Wo Contrast  12/23/2014   CLINICAL DATA:  Frontal headache since Monday with dizziness and visual change.  EXAM: CT HEAD WITHOUT CONTRAST  TECHNIQUE: Contiguous axial images were obtained from the base of the skull through the vertex without intravenous contrast.  COMPARISON:  2002 comparison not available  FINDINGS: Skull and Sinuses:Negative for fracture or destructive process. The mastoids, middle ears, and imaged paranasal sinuses are clear.  Orbits: No acute abnormality.  Brain: No evidence of acute infarction, hemorrhage, hydrocephalus, or mass lesion/mass effect. Negative suprasellar cistern.  IMPRESSION: Negative head CT.   Electronically Signed   By: Monte Fantasia M.D.   On: 12/23/2014 11:14     EKG Interpretation None      MDM   Final diagnoses:   Frontal headache    Patient presenting with frontal headache that started a proximally 5 days ago and has not resolved. She gets a severe pain in her for head that will last 1-2 hours and is the most severe pain she's ever had when that resolved she is still has a dull headache. She denies any sinus symptoms, trauma, recent neck manipulation. She denies any focal deficits such as weakness, numbness, vision changes, swallowing difficulty or speech. Patient is neurovascularly intact on exam today with normal gait, speech, strength and sensation. No bruits present in the neck. She does not have a history of coronary artery disease, hypertension, diabetes, hyperlipidemia.  Low suspicion for sinus etiology however concern for possible aneurysm versus migraine vs space occupying lesion. Patient given headache cocktail and CT of the head pending  12:20 PM Head CT neg.  when I went back in to evaluate if patient was feeling better after headache cocktail she was gone. Secretary informed me that patient had eloped. She had taken her  IV out and left. Unclear why.  Blanchie Dessert, MD 12/23/14 1221  Blanchie Dessert, MD 12/23/14 1221

## 2014-12-23 NOTE — Telephone Encounter (Signed)
Pt calls and states in the last week she had 4 episodes of sudden pain in her head the first being when she raised up in bed she felt and heard a pop then she states she felt something warm inside her head, like warm water running in her head, the next happened while on the commode and she bent over from a sitting position and suddenly the same pain came causing her to become weak and very nauseated. The next was raising from a sitting position and she had the same pain, nausea and inability to move this time. The next happened this am when rising from bed, same pain, nausea, inability to move, she states she cannot open her eyes when this happens, it resolves in appr 1 hour and she is ok til the next one. She desires to be seen but is advised to have someone take her to ED or call 911, not to drive since these episodes cause her to be unable to respond by seeing or moving

## 2014-12-23 NOTE — ED Notes (Signed)
Pt. Reports having a popping sensation in the middle of her forehead on Monday,.  She felt nausaeate, severe pain and pressure.  This lasted about hour and half and then decreased.  This week she has 5 episodes of this popping sensation in the past.   Each one is getting worse.  And she feels pressure in her forehead. No nuero deficits noted.  Speech is clear. PERLA +. Skin is warm and dry.

## 2014-12-23 NOTE — ED Notes (Signed)
Pt. Reports frontal HA since Monday. States constant throbbing pain with intermittent severe pain with associated nausea, sensitivity to light and noise.

## 2014-12-23 NOTE — ED Notes (Signed)
Walked into patient room to reassess patient, patient was not in room, monitoring was unhooked and gown was off. Looked in trash can and patient had pulled out IV.

## 2014-12-23 NOTE — Telephone Encounter (Signed)
Agree with ED evaluation. 

## 2015-01-13 ENCOUNTER — Ambulatory Visit: Payer: Self-pay

## 2015-02-22 ENCOUNTER — Other Ambulatory Visit: Payer: Self-pay | Admitting: *Deleted

## 2015-02-22 DIAGNOSIS — J4540 Moderate persistent asthma, uncomplicated: Secondary | ICD-10-CM

## 2015-02-22 DIAGNOSIS — J4530 Mild persistent asthma, uncomplicated: Secondary | ICD-10-CM

## 2015-02-28 ENCOUNTER — Ambulatory Visit (INDEPENDENT_AMBULATORY_CARE_PROVIDER_SITE_OTHER): Payer: Self-pay | Admitting: Internal Medicine

## 2015-02-28 ENCOUNTER — Encounter: Payer: Self-pay | Admitting: Internal Medicine

## 2015-02-28 VITALS — BP 115/75 | HR 87 | Temp 98.2°F | Ht 67.0 in | Wt 145.8 lb

## 2015-02-28 DIAGNOSIS — J4530 Mild persistent asthma, uncomplicated: Secondary | ICD-10-CM

## 2015-02-28 DIAGNOSIS — F17200 Nicotine dependence, unspecified, uncomplicated: Secondary | ICD-10-CM

## 2015-02-28 DIAGNOSIS — R300 Dysuria: Secondary | ICD-10-CM

## 2015-02-28 DIAGNOSIS — J45909 Unspecified asthma, uncomplicated: Secondary | ICD-10-CM

## 2015-02-28 LAB — POCT URINALYSIS DIPSTICK
Bilirubin, UA: NEGATIVE
Glucose, UA: NEGATIVE
Ketones, UA: NEGATIVE
Nitrite, UA: NEGATIVE
Spec Grav, UA: 1.025
Urobilinogen, UA: 0.2
pH, UA: 6.5

## 2015-02-28 MED ORDER — NITROFURANTOIN MONOHYD MACRO 100 MG PO CAPS: 100.0000 mg | ORAL_CAPSULE | Freq: Two times a day (BID) | ORAL | Status: AC

## 2015-02-28 MED ORDER — FLUTICASONE-SALMETEROL 250-50 MCG/DOSE IN AEPB: 1.0000 | INHALATION_SPRAY | Freq: Two times a day (BID) | RESPIRATORY_TRACT | Status: DC

## 2015-02-28 NOTE — Assessment & Plan Note (Signed)
Refilled refill of Advair per patient request

## 2015-02-28 NOTE — Progress Notes (Signed)
Patient ID: Megan Richardson, female   DOB: Nov 14, 1975, 39 y.o.   MRN: [id number]   Subjective:   HPI: Megan Richardson is a 39 y.o. woman with past medical history below presents with dysuria.  Reason(s) for visit:  Dysuria: Patient states that she has been experiencing increased urinary frequency and pain and pressure on passing urine. She also states that she has seen some bloody discoloration to her urine. However, she denies constitutional symptoms. She has no abdominal or flank pain. She is not sexually active. She does not have vaginal discharge. Her appetite and energy level have been normal.    Past Medical History  Diagnosis Date  . Asthma     With Exacerbations  . Constipation     SP normal colonoscopy 7/08, GI lebaur Dr. Elicia Lamp.  . Anxiety   . Hepatitis B immune   . Palpitations     Chronic  . GERD (gastroesophageal reflux disease)     ROS: Constitutional:  Denies fevers, chills, diaphoresis, appetite change and fatigue.  Respiratory: Denies SOB, DOE, cough, chest tightness, and wheezing.  CVS: No chest pain, palpitations and leg swelling.  GI: No abdominal pain, nausea, vomiting, bloody stools  MSK: No myalgias, back pain, joint swelling, arthralgias  Psych: No depression symptoms. No SI or SA.    Objective:  Physical Exam: Filed Vitals:   02/28/15 1552  BP: 115/75  Pulse: 87  Temp: 98.2 F (36.8 C)  TempSrc: Oral  Height: 5\' 7"  (1.702 m)  Weight: 145 lb 12.8 oz (66.134 kg)  SpO2: 100%   General: Well nourished. No acute distress.  HEENT: Normal oral mucosa. MMM.  Lungs: CTA bilaterally. No wheezing. Heart: RRR; no extra sounds or murmurs  Abdomen: Non-distended, normal bowel sounds, soft, nontender; no hepatosplenomegaly  Extremities: No pedal edema. No joint swelling or tenderness. Neurologic: Normal EOM,  Alert and oriented x3. No obvious neurologic/cranial nerve deficits.  Assessment & Plan:  Discussed case with Dr Daryll Drown See problem based  charting for assessment and plan.

## 2015-02-28 NOTE — Patient Instructions (Signed)
General Instructions: Please take Nitrofurantoin 100 mg twice daily for 7 days  Please come back if symptoms persist   Please bring your medicines with you each time you come to clinic.  Medicines may include prescription medications, over-the-counter medications, herbal remedies, eye drops, vitamins, or other pills.   Progress Toward Treatment Goals:  Treatment Goal 04/14/2014  Stop smoking smoking less    Self Care Goals & Plans:  Self Care Goal 04/14/2014  Manage my medications take my medicines as prescribed; bring my medications to every visit; refill my medications on time  Eat healthy foods drink diet soda or water instead of juice or soda; eat more vegetables; eat foods that are low in salt; eat baked foods instead of fried foods; eat fruit for snacks and desserts; eat smaller portions  Be physically active find an activity I enjoy  Meeting treatment goals maintain the current self-care plan    No flowsheet data found.   Care Management & Community Referrals:  Referral 04/14/2014  Referrals made to community resources none       Urinary Tract Infection Urinary tract infections (UTIs) can develop anywhere along your urinary tract. Your urinary tract is your body's drainage system for removing wastes and extra water. Your urinary tract includes two kidneys, two ureters, a bladder, and a urethra. Your kidneys are a pair of bean-shaped organs. Each kidney is about the size of your fist. They are located below your ribs, one on each side of your spine. CAUSES Infections are caused by microbes, which are microscopic organisms, including fungi, viruses, and bacteria. These organisms are so small that they can only be seen through a microscope. Bacteria are the microbes that most commonly cause UTIs. SYMPTOMS  Symptoms of UTIs may vary by age and gender of the patient and by the location of the infection. Symptoms in young women typically include a frequent and intense urge to  urinate and a painful, burning feeling in the bladder or urethra during urination. Older women and men are more likely to be tired, shaky, and weak and have muscle aches and abdominal pain. A fever may mean the infection is in your kidneys. Other symptoms of a kidney infection include pain in your back or sides below the ribs, nausea, and vomiting. DIAGNOSIS To diagnose a UTI, your caregiver will ask you about your symptoms. Your caregiver also will ask to provide a urine sample. The urine sample will be tested for bacteria and white blood cells. White blood cells are made by your body to help fight infection. TREATMENT  Typically, UTIs can be treated with medication. Because most UTIs are caused by a bacterial infection, they usually can be treated with the use of antibiotics. The choice of antibiotic and length of treatment depend on your symptoms and the type of bacteria causing your infection. HOME CARE INSTRUCTIONS  If you were prescribed antibiotics, take them exactly as your caregiver instructs you. Finish the medication even if you feel better after you have only taken some of the medication.  Drink enough water and fluids to keep your urine clear or pale yellow.  Avoid caffeine, tea, and carbonated beverages. They tend to irritate your bladder.  Empty your bladder often. Avoid holding urine for long periods of time.  Empty your bladder before and after sexual intercourse.  After a bowel movement, women should cleanse from front to back. Use each tissue only once. SEEK MEDICAL CARE IF:   You have back pain.  You develop a fever.  Your symptoms do not begin to resolve within 3 days. SEEK IMMEDIATE MEDICAL CARE IF:   You have severe back pain or lower abdominal pain.  You develop chills.  You have nausea or vomiting.  You have continued burning or discomfort with urination. MAKE SURE YOU:   Understand these instructions.  Will watch your condition.  Will get help right  away if you are not doing well or get worse. Document Released: 06/19/2005 Document Revised: 03/10/2012 Document Reviewed: 10/18/2011 [facility] Patient Information 2015 [place], [place]. This information is not intended to replace advice given to you by your health care provider. Make sure you discuss any questions you have with your health care provider.

## 2015-02-28 NOTE — Assessment & Plan Note (Addendum)
Assessment: Most likely diagnosis urinary tract infection/and complicated cystitis. I do not suspect STI. Urinalysis    Component Value Date/Time   COLORURINE YELLOW 02/28/2015 1610   APPEARANCEUR CLEAR 02/28/2015 1610   LABSPEC 1.010 02/28/2015 1610   PHURINE 6.0 02/28/2015 1610   GLUCOSEU NEG 02/28/2015 1610   HGBUR LARGE* 02/28/2015 1610   BILIRUBINUR NEGATIVE 02/28/2015 1643   BILIRUBINUR NEG 02/28/2015 1610   KETONESUR NEG 02/28/2015 1610   PROTEINUR 30MG  02/28/2015 1643   PROTEINUR NEG 02/28/2015 1610   UROBILINOGEN 0.2 02/28/2015 1643   UROBILINOGEN 0.2 02/28/2015 1610   NITRITE NEGATIVE 02/28/2015 1643   NITRITE POS* 02/28/2015 1610   LEUKOCYTESUR moderate (2+) 02/28/2015 1643   Urine dipstick positive for nitrites, leukocytes, bacteria, protein. Plan: Labs/imaging: Urine dipstick and urinalysis >Results above 1. Therapy: Macrobid 100 mg twice a day for 7 days. 2. Follow up: Provided her with information on home management of UTI and when to return.

## 2015-03-01 ENCOUNTER — Other Ambulatory Visit: Payer: Self-pay | Admitting: *Deleted

## 2015-03-01 LAB — URINALYSIS, MICROSCOPIC ONLY
Crystals: NONE SEEN
RBC / HPF: >50 RBC/hpf — AB (ref <3)
WBC, UA: >50 WBC/hpf — AB (ref <3)

## 2015-03-01 LAB — URINALYSIS, ROUTINE W REFLEX MICROSCOPIC
Bilirubin Urine: NEGATIVE
GLUCOSE, UA: NEGATIVE mg/dL
Ketones, ur: NEGATIVE mg/dL
Nitrite: POSITIVE — AB
PH: 6 (ref 5.0–8.0)
PROTEIN: NEGATIVE mg/dL
SPECIFIC GRAVITY, URINE: 1.01 (ref 1.005–1.030)
Urobilinogen, UA: 0.2 mg/dL (ref 0.0–1.0)

## 2015-03-01 NOTE — Telephone Encounter (Signed)
Called [facility] per dr Alice Rieger, they did not receive his fax of the inhaler or macrobid, gave teresa a. The scripts verbally.

## 2015-03-02 MED ORDER — ALBUTEROL SULFATE HFA 108 (90 BASE) MCG/ACT IN AERS: 2.0000 | INHALATION_SPRAY | Freq: Four times a day (QID) | RESPIRATORY_TRACT | Status: DC | PRN

## 2015-03-06 ENCOUNTER — Ambulatory Visit (INDEPENDENT_AMBULATORY_CARE_PROVIDER_SITE_OTHER): Payer: Self-pay | Admitting: Internal Medicine

## 2015-03-06 ENCOUNTER — Encounter: Payer: Self-pay | Admitting: Internal Medicine

## 2015-03-06 VITALS — BP 159/90 | HR 71 | Temp 98.4°F | Ht 67.0 in | Wt 145.3 lb

## 2015-03-06 DIAGNOSIS — Z206 Contact with and (suspected) exposure to human immunodeficiency virus [HIV]: Secondary | ICD-10-CM | POA: Insufficient documentation

## 2015-03-06 NOTE — Patient Instructions (Signed)
We will order some tests for sexually transmitted diseases Please come back in 3 days to see me again

## 2015-03-06 NOTE — Assessment & Plan Note (Signed)
Assessment: Patient reports exposure to HIV through her boyfriend who she recently broke up with 3 weeks ago. They have been together for 6 months. She had a negative HIV test in February 2016. She has no STI symptoms. She is very anxious about her HIV risk and would like to test.   Plan: 1. Labs/imaging: HIV antibody test, hep C, and G/C urine probe 2. Therapy: none 3. Follow up: Follow-up in 2 days to discuss results. If HIV is negative, I will repeat another antibody test in about 2-3 weeks

## 2015-03-06 NOTE — Progress Notes (Signed)
Patient ID: Megan Richardson, female   DOB: Jun 21, 1976, 39 y.o.   MRN: [id number]   Subjective:   HPI: Megan Richardson is a 39 y.o. woman with past medical history below presents for acute visit.   Reason(s) for visit:  HIV testing: She states that 3 weeks ago she broke up with her boyfriend of 6 months. Apparently, her boyfriend called her back and told her that he is HIV positive and she was unaware of his HIV status. She is very tearful and would like to have an HIV test. She states that she has not had any other sexual partners and she has not been using condoms. She currently has no STI symptoms.   Past Medical History  Diagnosis Date  . Asthma     With Exacerbations  . Constipation     SP normal colonoscopy 7/08, GI lebaur Dr. Elicia Lamp.  . Anxiety   . Hepatitis B immune   . Palpitations     Chronic  . GERD (gastroesophageal reflux disease)     ROS: Constitutional:  Denies fevers, chills, diaphoresis, appetite change and fatigue.  Respiratory: Denies SOB, DOE, cough, chest tightness, and wheezing.  CVS: No chest pain, palpitations and leg swelling.  GI: No abdominal pain, nausea, vomiting, bloody stools GU: No dysuria, frequency, hematuria, or flank pain.  MSK: No myalgias, back pain, joint swelling, arthralgias  Psych: No depression symptoms. No SI or SA.    Objective:  Physical Exam: Filed Vitals:   03/06/15 1457  BP: 159/90  Pulse: 71  Temp: 98.4 F (36.9 C)  TempSrc: Oral  Height: 5\' 7"  (1.702 m)  Weight: 145 lb 4.8 oz (65.908 kg)  SpO2: 100%   General: Well nourished. No acute distress. Tearful.  HEENT: Normal oral mucosa. MMM.  Lungs: CTA bilaterally. No wheezing. Heart: RRR; no extra sounds or murmurs  Abdomen: Non-distended, normal bowel sounds, soft, nontender; no hepatosplenomegaly  Extremities: No pedal edema. No joint swelling or tenderness. Neurologic: Normal EOM,  Alert and oriented x3. No obvious neurologic/cranial nerve  deficits.  Assessment & Plan:  Discussed case with Dr Lynnae January See problem based charting for assessment and plan.

## 2015-03-06 NOTE — Progress Notes (Signed)
Internal Medicine Clinic Attending  Case discussed with Dr. Kazibwe soon after the resident saw the patient.  We reviewed the resident's history and exam and pertinent patient test results.  I agree with the assessment, diagnosis, and plan of care documented in the resident's note. 

## 2015-03-07 LAB — HEPATITIS C ANTIBODY: HCV AB: NEGATIVE

## 2015-03-07 LAB — URINE CYTOLOGY ANCILLARY ONLY
Chlamydia: NEGATIVE
Neisseria Gonorrhea: NEGATIVE

## 2015-03-07 LAB — HIV ANTIBODY (ROUTINE TESTING W REFLEX): HIV 1&2 Ab, 4th Generation: NONREACTIVE

## 2015-03-07 NOTE — Telephone Encounter (Signed)
Called to pharm 

## 2015-03-07 NOTE — Progress Notes (Signed)
Internal Medicine Clinic Attending  Case discussed with Dr. Kazibwe soon after the resident saw the patient.  We reviewed the resident's history and exam and pertinent patient test results.  I agree with the assessment, diagnosis, and plan of care documented in the resident's note. 

## 2015-03-08 ENCOUNTER — Encounter: Payer: Self-pay | Admitting: Internal Medicine

## 2015-03-08 ENCOUNTER — Ambulatory Visit (INDEPENDENT_AMBULATORY_CARE_PROVIDER_SITE_OTHER): Payer: Self-pay | Admitting: Internal Medicine

## 2015-03-08 VITALS — BP 118/86 | HR 66 | Temp 98.5°F | Ht 67.0 in | Wt 143.7 lb

## 2015-03-08 DIAGNOSIS — Z206 Contact with and (suspected) exposure to human immunodeficiency virus [HIV]: Secondary | ICD-10-CM

## 2015-03-08 NOTE — Progress Notes (Signed)
Patient ID: Megan Richardson, female   DOB: July 16, 1976, 39 y.o.   MRN: [id number]   Subjective:   HPI: Megan Richardson is a 39 y.o. woman with past medical history below presents to discuss her recent HIV testing.   Reason(s) for visit:  HIV testing: She states that 3 weeks ago she broke up with her boyfriend of 6 months. She states that her x-boyfriend called her back and told her that he is HIV positive and she was unaware of his HIV status. I ordered an HIV test 2 days ago which came back negative. She currently has no STI symptoms.   Past Medical History  Diagnosis Date  . Asthma     With Exacerbations  . Constipation     SP normal colonoscopy 7/08, GI lebaur Dr. Elicia Lamp.  . Anxiety   . Hepatitis B immune   . Palpitations     Chronic  . GERD (gastroesophageal reflux disease)     ROS: Constitutional:  Denies fevers, chills, diaphoresis, appetite change and fatigue.  Respiratory: Denies SOB, DOE, cough, chest tightness, and wheezing.  CVS: No chest pain, palpitations and leg swelling.  GI: No abdominal pain, nausea, vomiting, bloody stools GU: No dysuria, frequency, hematuria, or flank pain.  MSK: No myalgias, back pain, joint swelling, arthralgias  Psych: No depression symptoms. No SI or SA.    Objective:  Physical Exam: Filed Vitals:   03/08/15 1457  BP: 118/86  Pulse: 66  Temp: 98.5 F (36.9 C)  TempSrc: Oral  Height: 5\' 7"  (1.702 m)  Weight: 143 lb 11.2 oz (65.182 kg)  SpO2: 99%   General: Well nourished. No acute distress.  HEENT: Normal oral mucosa. MMM.  Lungs: CTA bilaterally. No wheezing. Heart: RRR; no extra sounds or murmurs  Abdomen: Non-distended, normal bowel sounds, soft, nontender; no hepatosplenomegaly  Extremities: No pedal edema. No joint swelling or tenderness. Neurologic: Normal EOM,  Alert and oriented x3. No obvious neurologic/cranial nerve deficits.  Assessment & Plan:  Discussed case with Dr Daryll Drown See problem based charting for  assessment and plan.

## 2015-03-08 NOTE — Assessment & Plan Note (Signed)
Assessment: Patient reported exposure to HIV through her x-boyfriend who she recently broke up with 3 weeks ago. They had been together for 6 months and having unprotected sex. She had a negative HIV test in February 2016. She has no STI symptoms. HIV antibody test came back negative together with hep C, and G/C urine probe. Discussed results with her and counseled her on safe sexual practices. She will come back for a repeat HIV testing in 2 weeks.

## 2015-03-08 NOTE — Patient Instructions (Signed)
General Instructions: Please come back in 2-3 weeks for a repeat testing  Please bring your medicines with you each time you come to clinic.  Medicines may include prescription medications, over-the-counter medications, herbal remedies, eye drops, vitamins, or other pills.   Progress Toward Treatment Goals:  Treatment Goal 04/14/2014  Stop smoking smoking less    Self Care Goals & Plans:  Self Care Goal 04/14/2014  Manage my medications take my medicines as prescribed; bring my medications to every visit; refill my medications on time  Eat healthy foods drink diet soda or water instead of juice or soda; eat more vegetables; eat foods that are low in salt; eat baked foods instead of fried foods; eat fruit for snacks and desserts; eat smaller portions  Be physically active find an activity I enjoy  Meeting treatment goals maintain the current self-care plan    No flowsheet data found.   Care Management & Community Referrals:  Referral 04/14/2014  Referrals made to community resources none

## 2015-03-09 NOTE — Progress Notes (Signed)
Internal Medicine Clinic Attending  Case discussed with Dr. Kazibwe soon after the resident saw the patient.  We reviewed the resident's history and exam and pertinent patient test results.  I agree with the assessment, diagnosis, and plan of care documented in the resident's note. 

## 2015-04-17 ENCOUNTER — Encounter: Payer: Self-pay | Admitting: Internal Medicine

## 2015-05-03 ENCOUNTER — Ambulatory Visit (INDEPENDENT_AMBULATORY_CARE_PROVIDER_SITE_OTHER): Payer: Self-pay | Admitting: Internal Medicine

## 2015-05-03 ENCOUNTER — Encounter: Payer: Self-pay | Admitting: Internal Medicine

## 2015-05-03 VITALS — BP 122/81 | HR 66 | Temp 98.3°F | Ht 67.0 in | Wt 138.0 lb

## 2015-05-03 DIAGNOSIS — Z206 Contact with and (suspected) exposure to human immunodeficiency virus [HIV]: Secondary | ICD-10-CM

## 2015-05-03 DIAGNOSIS — J45909 Unspecified asthma, uncomplicated: Secondary | ICD-10-CM

## 2015-05-03 DIAGNOSIS — N390 Urinary tract infection, site not specified: Secondary | ICD-10-CM

## 2015-05-03 LAB — POCT URINALYSIS DIPSTICK
Bilirubin, UA: NEGATIVE
Glucose, UA: NEGATIVE
KETONES UA: NEGATIVE
Nitrite, UA: NEGATIVE
Urobilinogen, UA: 0.2
pH, UA: 6.5

## 2015-05-03 MED ORDER — NITROFURANTOIN MONOHYD MACRO 100 MG PO CAPS: 100.0000 mg | ORAL_CAPSULE | Freq: Two times a day (BID) | ORAL | Status: AC

## 2015-05-03 NOTE — Assessment & Plan Note (Signed)
Pt states she thought she had  a previous HIV exposure but her boyfriend was upset with her and lied to her about the HIV.  She also f/u at the [facility] and tested neg.  Reports consistent condom use. -educated regarding safe sex practices

## 2015-05-03 NOTE — Assessment & Plan Note (Signed)
-  no symptoms

## 2015-05-03 NOTE — Progress Notes (Signed)
Patient ID: Megan Richardson, female   DOB: 1976-02-09, 39 y.o.   MRN: [id number]     Subjective:   Patient ID: Megan Richardson female    DOB: October 08, 1975 39 y.o.    MRN: [id number] Health Maintenance Due: Health Maintenance Due  Topic Date Due  . INFLUENZA VACCINE  04/24/2015    _________________________________________________  HPI: Megan Richardson is a 39 y.o. female here for an acute visit for UTI symptoms.  Pt has a PMH outlined below.  Please see problem-based charting assessment and plan for further details of medical issues addressed at today's visit.  PMH: Past Medical History  Diagnosis Date  . Asthma     With Exacerbations  . Constipation     SP normal colonoscopy 7/08, GI lebaur Dr. Elicia Lamp.  . Anxiety   . Hepatitis B immune   . Palpitations     Chronic  . GERD (gastroesophageal reflux disease)     Medications: Current Outpatient Prescriptions on File Prior to Visit  Medication Sig Dispense Refill  . albuterol (VENTOLIN HFA) 108 (90 BASE) MCG/ACT inhaler Inhale 2 puffs into the lungs every 6 (six) hours as needed for wheezing or shortness of breath. 3 Inhaler 3  . Fluticasone-Salmeterol (ADVAIR DISKUS) 250-50 MCG/DOSE AEPB Inhale 1 puff into the lungs every 12 (twelve) hours. 60 each 11   No current facility-administered medications on file prior to visit.    Allergies: No Known Allergies  FH: Family History  Problem Relation Age of Onset  . BRCA 1/2 Maternal Grandmother   . Cancer Maternal Aunt     thoat   . Cancer Maternal Grandfather     lung    SH: Social History   Social History  . Marital Status: Married    Spouse Name: N/A  . Number of Children: N/A  . Years of Education: N/A   Social History Main Topics  . Smoking status: Former Smoker -- 0.10 packs/day    Types: Cigarettes  . Smokeless tobacco: Never Used     Comment: has cut down.  . Alcohol Use: 0.0 oz/week    0 Standard drinks or equivalent per week     Comment: Only  socially.   . Drug Use: No  . Sexual Activity: Not Asked   Other Topics Concern  . None   Social History Narrative   Lives with husband who is very ill. Pt does not work.       Financial assistance approved for 100% discount at [facility] and has [facility] card.     Review of Systems: Constitutional: Negative for fever, chills and weight loss.  Eyes: Negative for blurred vision.  Respiratory: Negative for cough and shortness of breath.  Cardiovascular: Negative for chest pain, palpitations and leg swelling.  Gastrointestinal: Negative for nausea, vomiting, abdominal pain, diarrhea, constipation and blood in stool.  Genitourinary: +dysuria, urgency and frequency.  Musculoskeletal: Negative for myalgias and back pain.  Neurological: Negative for dizziness, weakness and headaches.     Objective:   Vital Signs: Filed Vitals:   05/03/15 1016  BP: 122/81  Pulse: 66  Temp: 98.3 F (36.8 C)  TempSrc: Oral  Height: '5\' 7"'  (1.702 m)  Weight: 138 lb (62.596 kg)  SpO2: 100%     BP Readings from Last 3 Encounters:  05/03/15 122/81  03/08/15 118/86  03/06/15 159/90    Physical Exam: Constitutional: Vital signs reviewed.  Patient is in NAD and cooperative with exam.  Head: Normocephalic and atraumatic. Eyes: EOMI, conjunctivae  nl, no scleral icterus.  Neck: Supple. Cardiovascular: RRR, no MRG. Pulmonary/Chest: normal effort, CTAB, no wheezes, rales, or rhonchi. Abdominal: Soft. NT/ND +BS. Neurological: A&O x3, cranial nerves II-XII are grossly intact, moving all extremities. Extremities: No pitting edema. Skin: Warm, dry and intact. No rash.   Assessment & Plan:   Assessment and plan was discussed and formulated with my attending.

## 2015-05-03 NOTE — Assessment & Plan Note (Addendum)
Pt p/w symptoms c/w UTI since Monday.  Endorses dysuria, hematuria, and frequency.  Denies N/V, fever/chills, back pain.  No vaginal discharge or itching.  Has remote h/o gonorrhea/chlamydia. States feels like previous UTI.  Reports her urethra feels like it is in a "spasm."  LMP August 1.  Had tubal ligation years ago.  Took macrobid in the past which worked for her.  No insurance but has the orange card so prefers meds through the [facility].  -macrobid bid x 7 days -could try AZO OTC for a couple of days  -provided info on UTI -educated to urinate prior to coitus and after coitus and always wiping front to back

## 2015-05-03 NOTE — Patient Instructions (Signed)
Thank you for your visit today.   Please return to the internal medicine clinic in as needed.     I have made the following additions/changes to your medications:   Please take macrobid every 12 hours for 7 days.  Make sure you don't miss any doses.  You may also want to take some AZO over the counter if you have pain.   Always wipe from front to back.  Always urinate before and after sexual intercourse.  I will call you only if your lab results come back positive.    Please be sure to bring all of your medications with you to every visit; this includes herbal supplements, vitamins, eye drops, and any over-the-counter medications.   Should you have any questions regarding your medications and/or any new or worsening symptoms, please be sure to call the clinic at [phone].   If you believe that you are suffering from a life threatening condition or one that may result in the loss of limb or function, then you should call 911 and proceed to the nearest Emergency Department.   Urinary Tract Infection Urinary tract infections (UTIs) can develop anywhere along your urinary tract. Your urinary tract is your body's drainage system for removing wastes and extra water. Your urinary tract includes two kidneys, two ureters, a bladder, and a urethra. Your kidneys are a pair of bean-shaped organs. Each kidney is about the size of your fist. They are located below your ribs, one on each side of your spine. CAUSES Infections are caused by microbes, which are microscopic organisms, including fungi, viruses, and bacteria. These organisms are so small that they can only be seen through a microscope. Bacteria are the microbes that most commonly cause UTIs. SYMPTOMS  Symptoms of UTIs may vary by age and gender of the patient and by the location of the infection. Symptoms in young women typically include a frequent and intense urge to urinate and a painful, burning feeling in the bladder or urethra during  urination. Older women and men are more likely to be tired, shaky, and weak and have muscle aches and abdominal pain. A fever may mean the infection is in your kidneys. Other symptoms of a kidney infection include pain in your back or sides below the ribs, nausea, and vomiting. DIAGNOSIS To diagnose a UTI, your caregiver will ask you about your symptoms. Your caregiver also will ask to provide a urine sample. The urine sample will be tested for bacteria and white blood cells. White blood cells are made by your body to help fight infection. TREATMENT  Typically, UTIs can be treated with medication. Because most UTIs are caused by a bacterial infection, they usually can be treated with the use of antibiotics. The choice of antibiotic and length of treatment depend on your symptoms and the type of bacteria causing your infection. HOME CARE INSTRUCTIONS  If you were prescribed antibiotics, take them exactly as your caregiver instructs you. Finish the medication even if you feel better after you have only taken some of the medication.  Drink enough water and fluids to keep your urine clear or pale yellow.  Avoid caffeine, tea, and carbonated beverages. They tend to irritate your bladder.  Empty your bladder often. Avoid holding urine for long periods of time.  Empty your bladder before and after sexual intercourse.  After a bowel movement, women should cleanse from front to back. Use each tissue only once. SEEK MEDICAL CARE IF:   You have back pain.  You  develop a fever.  Your symptoms do not begin to resolve within 3 days. SEEK IMMEDIATE MEDICAL CARE IF:   You have severe back pain or lower abdominal pain.  You develop chills.  You have nausea or vomiting.  You have continued burning or discomfort with urination. MAKE SURE YOU:   Understand these instructions.  Will watch your condition.  Will get help right away if you are not doing well or get worse. Document Released:  06/19/2005 Document Revised: 03/10/2012 Document Reviewed: 10/18/2011 [facility] Patient Information 2015 [place], [place]. This information is not intended to replace advice given to you by your health care provider. Make sure you discuss any questions you have with your health care provider.

## 2015-05-04 ENCOUNTER — Other Ambulatory Visit: Payer: Self-pay | Admitting: Internal Medicine

## 2015-05-04 LAB — URINALYSIS, ROUTINE W REFLEX MICROSCOPIC
Bilirubin, UA: NEGATIVE
Glucose, UA: NEGATIVE
Ketones, UA: NEGATIVE
Nitrite, UA: NEGATIVE
PH UA: 6 (ref 5.0–7.5)
Specific Gravity, UA: 1.02 (ref 1.005–1.030)
Urobilinogen, Ur: 0.2 mg/dL (ref 0.2–1.0)

## 2015-05-04 LAB — MICROSCOPIC EXAMINATION
CASTS: NONE SEEN /LPF
RBC, UA: >30 /hpf — AB (ref 0–?)
WBC, UA: >30 /hpf — AB (ref 0–?)

## 2015-05-04 LAB — URINE CYTOLOGY ANCILLARY ONLY
Chlamydia: NEGATIVE
Neisseria Gonorrhea: NEGATIVE
Trichomonas: POSITIVE — AB

## 2015-05-04 MED ORDER — METRONIDAZOLE 500 MG PO TABS: 2000.0000 mg | ORAL_TABLET | Freq: Once | ORAL | Status: DC

## 2015-05-04 NOTE — Progress Notes (Signed)
Internal Medicine Clinic Attending  Case discussed with Dr. Gill soon after the resident saw the patient.  We reviewed the resident's history and exam and pertinent patient test results.  I agree with the assessment, diagnosis, and plan of care documented in the resident's note.  

## 2015-05-05 LAB — URINE CULTURE

## 2015-05-08 NOTE — Progress Notes (Signed)
Talked with pt this AM 05/08/15.

## 2015-05-08 NOTE — Progress Notes (Signed)
Pt aware per Dr Gordy Levan.

## 2015-06-21 ENCOUNTER — Ambulatory Visit (INDEPENDENT_AMBULATORY_CARE_PROVIDER_SITE_OTHER): Payer: Self-pay | Admitting: Internal Medicine

## 2015-06-21 VITALS — BP 121/89 | HR 76 | Temp 98.3°F | Wt 132.2 lb

## 2015-06-21 DIAGNOSIS — Z8619 Personal history of other infectious and parasitic diseases: Secondary | ICD-10-CM

## 2015-06-21 DIAGNOSIS — N898 Other specified noninflammatory disorders of vagina: Secondary | ICD-10-CM

## 2015-06-21 NOTE — Patient Instructions (Signed)
WE WILL CALL YOU WITH RESULTS AND WITH YOUR TREATMENT PLAN.   Trichomoniasis Trichomoniasis is an infection caused by an organism called Trichomonas. The infection can affect both women and men. In women, the outer female genitalia and the vagina are affected. In men, the penis is mainly affected, but the prostate and other reproductive organs can also be involved. Trichomoniasis is a sexually transmitted infection (STI) and is most often passed to another person through sexual contact.  RISK FACTORS  Having unprotected sexual intercourse.  Having sexual intercourse with an infected partner. SIGNS AND SYMPTOMS  Symptoms of trichomoniasis in women include:  Abnormal gray-green frothy vaginal discharge.  Itching and irritation of the vagina.  Itching and irritation of the area outside the vagina. Symptoms of trichomoniasis in men include:   Penile discharge with or without pain.  Pain during urination. This results from inflammation of the urethra. DIAGNOSIS  Trichomoniasis may be found during a Pap test or physical exam. Your health care Reghan Thul may use one of the following methods to help diagnose this infection:  Examining vaginal discharge under a microscope. For men, urethral discharge would be examined.  Testing the pH of the vagina with a test tape.  Using a vaginal swab test that checks for the Trichomonas organism. A test is available that provides results within a few minutes.  Doing a culture test for the organism. This is not usually needed. TREATMENT   You may be given medicine to fight the infection. Women should inform their health care Isobel Eisenhuth if they could be or are pregnant. Some medicines used to treat the infection should not be taken during pregnancy.  Your health care Kaveh Kissinger may recommend over-the-counter medicines or creams to decrease itching or irritation.  Your sexual partner will need to be treated if infected. HOME CARE INSTRUCTIONS   Take  medicines only as directed by your health care Jaymen Fetch.  Take over-the-counter medicine for itching or irritation as directed by your health care Sergey Ishler.  Do not have sexual intercourse while you have the infection.  Women should not douche or wear tampons while they have the infection.  Discuss your infection with your partner. Your partner may have gotten the infection from you, or you may have gotten it from your partner.  Have your sex partner get examined and treated if necessary.  Practice safe, informed, and protected sex.  See your health care Yaphet Smethurst for other STI testing. SEEK MEDICAL CARE IF:   You still have symptoms after you finish your medicine.  You develop abdominal pain.  You have pain when you urinate.  You have bleeding after sexual intercourse.  You develop a rash.  Your medicine makes you sick or makes you throw up (vomit). MAKE SURE YOU:  Understand these instructions.  Will watch your condition.  Will get help right away if you are not doing well or get worse. Document Released: 03/05/2001 Document Revised: 01/24/2014 Document Reviewed: 06/21/2013 [facility] Patient Information 2015 [place], [place]. This information is not intended to replace advice given to you by your health care Zacharius Funari. Make sure you discuss any questions you have with your health care Emiley Digiacomo.

## 2015-06-21 NOTE — Progress Notes (Signed)
Subjective:    Patient ID: Megan Richardson, female    DOB: October 19, 1975, 39 y.o.   MRN: [id number]  HPI [name] is a 39 y.o. female with PMHx of asthma and anxiety who presents to the clinic for complaint of increased urinary urgency. Please see A&P for the status of the patient's chronic medical problems.   Past Medical History  Diagnosis Date  . Asthma     With Exacerbations  . Constipation     SP normal colonoscopy 7/08, GI lebaur Dr. Elicia Lamp.  . Anxiety   . Hepatitis B immune   . Palpitations     Chronic  . GERD (gastroesophageal reflux disease)     Outpatient Encounter Prescriptions as of 06/21/2015  Medication Sig  . albuterol (VENTOLIN HFA) 108 (90 BASE) MCG/ACT inhaler Inhale 2 puffs into the lungs every 6 (six) hours as needed for wheezing or shortness of breath.  . Fluticasone-Salmeterol (ADVAIR DISKUS) 250-50 MCG/DOSE AEPB Inhale 1 puff into the lungs every 12 (twelve) hours.  . metroNIDAZOLE (FLAGYL) 500 MG tablet Take 4 tablets (2,000 mg total) by mouth once.   No facility-administered encounter medications on file as of 06/21/2015.    Family History  Problem Relation Age of Onset  . BRCA 1/2 Maternal Grandmother   . Cancer Maternal Aunt     thoat   . Cancer Maternal Grandfather     lung    Social History   Social History  . Marital Status: Married    Spouse Name: N/A  . Number of Children: N/A  . Years of Education: N/A   Occupational History  . Not on file.   Social History Main Topics  . Smoking status: Former Smoker -- 0.10 packs/day    Types: Cigarettes  . Smokeless tobacco: Never Used     Comment: has cut down.  . Alcohol Use: 0.0 oz/week    0 Standard drinks or equivalent per week     Comment: Only socially.   . Drug Use: No  . Sexual Activity: Not on file   Other Topics Concern  . Not on file   Social History Narrative   Lives with husband who is very ill. Pt does not work.       Financial assistance approved for 100%  discount at [facility] and has [facility] card.     Review of Systems General: Denies fever, chills, fatigue  Respiratory: Denies SOB, cough  Cardiovascular: Denies chest pain and palpitations.  Gastrointestinal: Denies nausea, vomiting, abdominal pain Genitourinary: Admits to malodorous discharge and urinary urgency. Denies dysuria, frequency, hematuria, suprapubic pain and flank pain. Musculoskeletal: Denies myalgias, back pain Skin: Denies pallor, rash and wounds.  Neurological: Denies dizziness, headaches, weakness, lightheadedness     Objective:   Physical Exam Filed Vitals:   06/21/15 0818  BP: 121/89  Pulse: 76  Temp: 98.3 F (36.8 C)  TempSrc: Oral  Weight: 132 lb 3.2 oz (59.966 kg)  SpO2: 100%   General: Vital signs reviewed.  Patient is well-developed and well-nourished, in no acute distress and cooperative with exam.  Cardiovascular: RRR, S1 normal, S2 normal, no murmurs, gallops, or rubs. Pulmonary/Chest: Clear to auscultation bilaterally, no wheezes, rales, or rhonchi. Abdominal: Soft, non-tender, non-distended, BS + Extremities: No lower extremity edema bilaterally Skin: Warm, dry and intact. No rashes or erythema. Psychiatric: Normal mood and affect. speech and behavior is normal. Cognition and memory are normal.     Assessment & Plan:   Please see problem based assessment  and plan.

## 2015-06-21 NOTE — Assessment & Plan Note (Signed)
Patient presents with complaint of urinary urgency and malodorous discharge. She was previously treated for a trichomonas infection in August; however, her husband wasn't treated. Her symptoms did clear up after treatment with Flagyl. She denies any associated fever, chills, dysuria, frequency, hematuria, suprapubic pain, flank pain, nausea or vomiting. Her urinalysis was negative for infection.   Plan: -Cervicovaginal ancillary testing for STDs -Will treat patient and husband based on results

## 2015-06-22 ENCOUNTER — Telehealth: Payer: Self-pay | Admitting: *Deleted

## 2015-06-22 ENCOUNTER — Other Ambulatory Visit: Payer: Self-pay | Admitting: Internal Medicine

## 2015-06-22 DIAGNOSIS — B9689 Other specified bacterial agents as the cause of diseases classified elsewhere: Secondary | ICD-10-CM | POA: Insufficient documentation

## 2015-06-22 DIAGNOSIS — N760 Acute vaginitis: Secondary | ICD-10-CM | POA: Insufficient documentation

## 2015-06-22 LAB — CERVICOVAGINAL ANCILLARY ONLY
Chlamydia: NEGATIVE
Neisseria Gonorrhea: NEGATIVE
WET PREP (BD AFFIRM): POSITIVE — AB

## 2015-06-22 MED ORDER — METRONIDAZOLE 500 MG PO TABS: 500.0000 mg | ORAL_TABLET | Freq: Two times a day (BID) | ORAL | Status: DC

## 2015-06-22 NOTE — Telephone Encounter (Signed)
-----   Message from Vickii Chafe, MD sent at 06/22/2015  1:25 PM EDT ----- Regarding: Prescription Ulis Rias,  Do you mind calling this patient and let her know she was positive for Gardnerella (Bacterial Vaginosis)? Also do you mind faxing in her prescription for Flagyl to the health department?  Thank you. Please let me know if you have any questions! [place]

## 2015-06-22 NOTE — Progress Notes (Signed)
Internal Medicine Clinic Attending  Case discussed with Dr. Richardson soon after the resident saw the patient.  We reviewed the resident's history and exam and pertinent patient test results.  I agree with the assessment, diagnosis, and plan of care documented in the resident's note. 

## 2015-06-22 NOTE — Telephone Encounter (Signed)
Call made to patient informing her of test results-Gardnerella. Pt to pick up for Flagyl at the health dept.  Also wanted to know if test showed trichomonas and it does not. Phone call complete.Despina Hidden Cassady9/29/20162:27 PM

## 2015-08-16 ENCOUNTER — Ambulatory Visit: Payer: Self-pay | Admitting: Internal Medicine

## 2015-08-24 ENCOUNTER — Encounter: Payer: Self-pay | Admitting: Internal Medicine

## 2015-08-24 ENCOUNTER — Ambulatory Visit: Payer: Self-pay | Admitting: Internal Medicine

## 2015-10-16 ENCOUNTER — Ambulatory Visit: Payer: Self-pay | Admitting: Internal Medicine

## 2016-01-01 ENCOUNTER — Emergency Department (HOSPITAL_COMMUNITY)
Admission: EM | Admit: 2016-01-01 | Discharge: 2016-01-01 | Disposition: A | Discharge status: Home / Self Care | Payer: Self-pay | Attending: Emergency Medicine | Admitting: Emergency Medicine

## 2016-01-01 ENCOUNTER — Emergency Department (HOSPITAL_COMMUNITY): Payer: Self-pay

## 2016-01-01 ENCOUNTER — Encounter (HOSPITAL_COMMUNITY): Payer: Self-pay | Admitting: *Deleted

## 2016-01-01 DIAGNOSIS — S8012XA Contusion of left lower leg, initial encounter: Secondary | ICD-10-CM | POA: Insufficient documentation

## 2016-01-01 DIAGNOSIS — Z79899 Other long term (current) drug therapy: Secondary | ICD-10-CM | POA: Insufficient documentation

## 2016-01-01 DIAGNOSIS — W2111XA Struck by baseball bat, initial encounter: Secondary | ICD-10-CM | POA: Insufficient documentation

## 2016-01-01 DIAGNOSIS — Z8659 Personal history of other mental and behavioral disorders: Secondary | ICD-10-CM | POA: Insufficient documentation

## 2016-01-01 DIAGNOSIS — J45909 Unspecified asthma, uncomplicated: Secondary | ICD-10-CM | POA: Insufficient documentation

## 2016-01-01 DIAGNOSIS — Z8719 Personal history of other diseases of the digestive system: Secondary | ICD-10-CM | POA: Insufficient documentation

## 2016-01-01 DIAGNOSIS — Z87891 Personal history of nicotine dependence: Secondary | ICD-10-CM | POA: Insufficient documentation

## 2016-01-01 DIAGNOSIS — Y998 Other external cause status: Secondary | ICD-10-CM | POA: Insufficient documentation

## 2016-01-01 DIAGNOSIS — M79605 Pain in left leg: Secondary | ICD-10-CM

## 2016-01-01 DIAGNOSIS — Y9364 Activity, baseball: Secondary | ICD-10-CM | POA: Insufficient documentation

## 2016-01-01 DIAGNOSIS — Y92320 Baseball field as the place of occurrence of the external cause: Secondary | ICD-10-CM | POA: Insufficient documentation

## 2016-01-01 DIAGNOSIS — S199XXA Unspecified injury of neck, initial encounter: Secondary | ICD-10-CM | POA: Insufficient documentation

## 2016-01-01 MED ORDER — IBUPROFEN 800 MG PO TABS
800.0000 mg | ORAL_TABLET | Freq: Once | ORAL | Status: AC
  Administered 2016-01-01: 800 mg via ORAL
  Filled 2016-01-01: qty 1

## 2016-01-01 NOTE — ED Notes (Signed)
Patient transported to X-ray 
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
## 2016-01-01 NOTE — Discharge Instructions (Signed)
Treatment: You may take ibuprofen every 4-6 hours as needed for the pain. Use ice 20 minutes on, 20 minutes off. Try to rest and elevate leg as much as possible.   Follow-up: Please see your doctor at the outpatient clinic if your symptoms are not improving or they are worsening. Please return to emergency department if your symptoms change, or are worsening, or you develop any new or concerning symptoms.   Contusion A contusion is a deep bruise. Contusions are the result of a blunt injury to tissues and muscle fibers under the skin. The injury causes bleeding under the skin. The skin overlying the contusion may turn blue, purple, or yellow. Minor injuries will give you a painless contusion, but more severe contusions may stay painful and swollen for a few weeks.  CAUSES  This condition is usually caused by a blow, trauma, or direct force to an area of the body. SYMPTOMS  Symptoms of this condition include:  Swelling of the injured area.  Pain and tenderness in the injured area.  Discoloration. The area may have redness and then turn blue, purple, or yellow. DIAGNOSIS  This condition is diagnosed based on a physical exam and medical history. An X-ray, CT scan, or MRI may be needed to determine if there are any associated injuries, such as broken bones (fractures). TREATMENT  Specific treatment for this condition depends on what area of the body was injured. In general, the best treatment for a contusion is resting, icing, applying pressure to (compression), and elevating the injured area. This is often called the RICE strategy. Over-the-counter anti-inflammatory medicines may also be recommended for pain control.  HOME CARE INSTRUCTIONS   Rest the injured area.  If directed, apply ice to the injured area:  Put ice in a plastic bag.  Place a towel between your skin and the bag.  Leave the ice on for 20 minutes, 2-3 times per day.  If directed, apply light compression to the injured  area using an elastic bandage. Make sure the bandage is not wrapped too tightly. Remove and reapply the bandage as directed by your health care provider.  If possible, raise (elevate) the injured area above the level of your heart while you are sitting or lying down.  Take over-the-counter and prescription medicines only as told by your health care provider. SEEK MEDICAL CARE IF:  Your symptoms do not improve after several days of treatment.  Your symptoms get worse.  You have difficulty moving the injured area. SEEK IMMEDIATE MEDICAL CARE IF:   You have severe pain.  You have numbness in a hand or foot.  Your hand or foot turns pale or cold.   This information is not intended to replace advice given to you by your health care provider. Make sure you discuss any questions you have with your health care provider.   Document Released: 06/19/2005 Document Revised: 05/31/2015 Document Reviewed: 01/25/2015 Elsevier Interactive Patient Education [employer].
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
## 2016-01-13 ENCOUNTER — Emergency Department (HOSPITAL_COMMUNITY)
Admission: EM | Admit: 2016-01-13 | Discharge: 2016-01-13 | Disposition: A | Discharge status: Home / Self Care | Payer: Self-pay | Attending: Emergency Medicine | Admitting: Emergency Medicine

## 2016-01-13 ENCOUNTER — Encounter (HOSPITAL_COMMUNITY): Payer: Self-pay | Admitting: Emergency Medicine

## 2016-01-13 ENCOUNTER — Emergency Department (HOSPITAL_COMMUNITY): Payer: Self-pay

## 2016-01-13 DIAGNOSIS — Z87891 Personal history of nicotine dependence: Secondary | ICD-10-CM | POA: Insufficient documentation

## 2016-01-13 DIAGNOSIS — Y9389 Activity, other specified: Secondary | ICD-10-CM | POA: Insufficient documentation

## 2016-01-13 DIAGNOSIS — Z79899 Other long term (current) drug therapy: Secondary | ICD-10-CM | POA: Insufficient documentation

## 2016-01-13 DIAGNOSIS — Y999 Unspecified external cause status: Secondary | ICD-10-CM | POA: Insufficient documentation

## 2016-01-13 DIAGNOSIS — S42402A Unspecified fracture of lower end of left humerus, initial encounter for closed fracture: Secondary | ICD-10-CM | POA: Insufficient documentation

## 2016-01-13 DIAGNOSIS — Y929 Unspecified place or not applicable: Secondary | ICD-10-CM | POA: Insufficient documentation

## 2016-01-13 DIAGNOSIS — W19XXXA Unspecified fall, initial encounter: Secondary | ICD-10-CM | POA: Insufficient documentation

## 2016-01-13 DIAGNOSIS — J45909 Unspecified asthma, uncomplicated: Secondary | ICD-10-CM | POA: Insufficient documentation

## 2016-01-13 DIAGNOSIS — Z7951 Long term (current) use of inhaled steroids: Secondary | ICD-10-CM | POA: Insufficient documentation

## 2016-01-13 MED ORDER — HYDROCODONE-ACETAMINOPHEN 5-325 MG PO TABS: 1.0000 | ORAL_TABLET | Freq: Four times a day (QID) | ORAL | Status: DC | PRN

## 2016-01-13 MED ORDER — HYDROCODONE-ACETAMINOPHEN 5-325 MG PO TABS
1.0000 | ORAL_TABLET | Freq: Once | ORAL | Status: AC
  Administered 2016-01-13: 1 via ORAL
  Filled 2016-01-13: qty 1

## 2016-01-13 NOTE — Discharge Instructions (Signed)
Please call and follow up with hand specialist next week for further management of your broken elbow.    Elbow Fracture, Simple A fracture is a break in one of the bones.When fractures are not displaced or separated, they may be treated with only a sling or splint. The sling or splint may only be required for two to three weeks. In these cases, often the elbow is put through early range of motion exercises to prevent the elbow from getting stiff. DIAGNOSIS  The diagnosis (learning what is wrong) of a fractured elbow is made by x-ray. These may be required before and after the elbow is put into a splint or cast. X-rays are taken after to make sure the bone pieces have not moved. HOME CARE INSTRUCTIONS   Only take over-the-counter or prescription medicines for pain, discomfort, or fever as directed by your caregiver.  If you have a splint held on with an elastic wrap and your hand or fingers become numb or cold and blue, loosen the wrap and reapply more loosely. See your caregiver if there is no relief.  You may use ice for twenty minutes, four times per day, for the first two to three days.  Use your elbow as directed.  See your caregiver as directed. It is very important to keep all follow-up referrals and appointments in order to avoid any long-term problems with your elbow including chronic pain or stiffness. SEEK IMMEDIATE MEDICAL CARE IF:   There is swelling or increasing pain in elbow.  You begin to lose feeling or experience numbness or tingling in your hand or fingers.  You develop swelling of the hand and fingers.  You get a cold or blue hand or fingers on affected side. MAKE SURE YOU:   Understand these instructions.  Will watch your condition.  Will get help right away if you are not doing well or get worse.   This information is not intended to replace advice given to you by your health care provider. Make sure you discuss any questions you have with your health care  provider.   Document Released: 09/03/2001 Document Revised: 12/02/2011 Document Reviewed: 07/25/2009 Elsevier Interactive Patient Education [employer].

## 2016-01-13 NOTE — ED Notes (Signed)
Pt refused to get undressed for exam.
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
## 2016-01-13 NOTE — ED Notes (Signed)
Ortho tech in room 

## 2016-01-22 ENCOUNTER — Encounter (HOSPITAL_COMMUNITY): Payer: Self-pay | Admitting: *Deleted

## 2016-01-22 ENCOUNTER — Ambulatory Visit (HOSPITAL_COMMUNITY)
Admission: EM | Admit: 2016-01-22 | Discharge: 2016-01-22 | Disposition: A | Discharge status: Home / Self Care | Payer: Self-pay | Attending: Family Medicine | Admitting: Family Medicine

## 2016-01-22 DIAGNOSIS — N390 Urinary tract infection, site not specified: Secondary | ICD-10-CM

## 2016-01-22 LAB — POCT PREGNANCY, URINE: PREG TEST UR: NEGATIVE

## 2016-01-22 MED ORDER — CEPHALEXIN 500 MG PO CAPS: 500.0000 mg | ORAL_CAPSULE | Freq: Four times a day (QID) | ORAL | Status: DC

## 2016-01-22 NOTE — Discharge Instructions (Signed)
Take all of medicine as directed, drink lots of fluids, see your doctor if further problems. °

## 2016-01-22 NOTE — ED Notes (Signed)
Pt  Reports   Symptoms  Of     Burning  On  Urination  With  Frequent  Scanty  Output       With  Symptoms  X  1  Day      pt  Reports      Symptoms  Not  releived  By  Abel Presto

## 2016-01-22 NOTE — ED Provider Notes (Signed)
CSN: [id number]     Arrival date & time 01/22/16  1724 History   First MD Initiated Contact with Patient 01/22/16 1817     Chief Complaint  Patient presents with  . Urinary Tract Infection   (Consider location/radiation/quality/duration/timing/severity/associated sxs/prior Treatment) Patient is a 40 y.o. female presenting with urinary tract infection. The history is provided by the patient.  Urinary Tract Infection Pain quality:  Burning, sharp and stabbing Pain severity:  Moderate Onset quality:  Sudden Duration:  1 day Progression:  Unchanged Chronicity:  New Recent urinary tract infections: no   Relieved by:  None tried Worsened by:  Nothing tried Ineffective treatments:  None tried Urinary symptoms: frequent urination   Associated symptoms: no fever, no flank pain, no nausea, no vaginal discharge and no vomiting     Past Medical History  Diagnosis Date  . Asthma     With Exacerbations  . Constipation     SP normal colonoscopy 7/08, GI lebaur Dr. Elicia Lamp.  . Anxiety   . Hepatitis B immune   . Palpitations     Chronic  . GERD (gastroesophageal reflux disease)    Past Surgical History  Procedure Laterality Date  . Tubal ligation  2006  . Cesarean section      Multiple   Family History  Problem Relation Age of Onset  . BRCA 1/2 Maternal Grandmother   . Cancer Maternal Aunt     thoat   . Cancer Maternal Grandfather     lung   Social History  Substance Use Topics  . Smoking status: Former Smoker -- 0.10 packs/day    Types: Cigarettes  . Smokeless tobacco: Never Used     Comment: has cut down.  . Alcohol Use: 0.0 oz/week    0 Standard drinks or equivalent per week     Comment: Only socially.    OB History    No data available     Review of Systems  Constitutional: Negative.  Negative for fever, chills, activity change and appetite change.  Gastrointestinal: Negative.  Negative for nausea and vomiting.  Genitourinary: Positive for dysuria, urgency and  frequency. Negative for flank pain, vaginal discharge, menstrual problem and pelvic pain.  All other systems reviewed and are negative.   Allergies  Review of patient's allergies indicates no known allergies.  Home Medications   Prior to Admission medications   Medication Sig Start Date End Date Taking? Authorizing Provider  albuterol (VENTOLIN HFA) 108 (90 BASE) MCG/ACT inhaler Inhale 2 puffs into the lungs every 6 (six) hours as needed for wheezing or shortness of breath. 03/02/15   Francesca Oman, DO  cephALEXin (KEFLEX) 500 MG capsule Take 1 capsule (500 mg total) by mouth 4 (four) times daily. Take all of medicine and drink lots of fluids 01/22/16   Billy Fischer, MD  Fluticasone-Salmeterol (ADVAIR DISKUS) 250-50 MCG/DOSE AEPB Inhale 1 puff into the lungs every 12 (twelve) hours. Patient not taking: Reported on 01/01/2016 02/28/15   Jessee Avers, MD  HYDROcodone-acetaminophen (NORCO/VICODIN) 5-325 MG tablet Take 1 tablet by mouth every 6 (six) hours as needed for moderate pain or severe pain. 01/13/16   Domenic Moras, PA-C  metroNIDAZOLE (FLAGYL) 500 MG tablet Take 1 tablet (500 mg total) by mouth 2 (two) times daily. Patient not taking: Reported on 01/01/2016 06/22/15   Florinda Marker, MD   Meds Ordered and Administered this Visit  Medications - No data to display  BP 126/89 mmHg  Pulse 60  Temp(Src) 98.8 F (  37.1 C) (Oral)  Resp 16  SpO2 100%  LMP 01/03/2016 No data found.   Physical Exam  Constitutional: She is oriented to person, place, and time. She appears well-developed and well-nourished. No distress.  Abdominal: Soft. Bowel sounds are normal. She exhibits no distension and no mass. There is tenderness in the suprapubic area. There is no rigidity, no rebound, no guarding and no CVA tenderness.  Neurological: She is alert and oriented to person, place, and time.  Skin: Skin is warm and dry.  Nursing note and vitals reviewed.   ED Course  Procedures (including critical care  time)  Labs Review Labs Reviewed  POCT PREGNANCY, URINE    Imaging Review No results found.   Visual Acuity Review  Right Eye Distance:   Left Eye Distance:   Bilateral Distance:    Right Eye Near:   Left Eye Near:    Bilateral Near:         MDM   1. UTI (lower urinary tract infection)    Meds ordered this encounter  Medications  . cephALEXin (KEFLEX) 500 MG capsule    Sig: Take 1 capsule (500 mg total) by mouth 4 (four) times daily. Take all of medicine and drink lots of fluids    Dispense:  20 capsule    Refill:  0        Billy Fischer, MD 01/22/16 [phone]

## 2016-02-08 ENCOUNTER — Encounter: Payer: Self-pay | Admitting: Internal Medicine

## 2016-02-22 ENCOUNTER — Encounter: Payer: Self-pay | Admitting: Internal Medicine

## 2016-02-22 ENCOUNTER — Ambulatory Visit (INDEPENDENT_AMBULATORY_CARE_PROVIDER_SITE_OTHER): Payer: Self-pay | Admitting: Internal Medicine

## 2016-02-22 VITALS — BP 120/79 | HR 68 | Temp 98.2°F | Ht 67.0 in | Wt 137.8 lb

## 2016-02-22 DIAGNOSIS — J45909 Unspecified asthma, uncomplicated: Secondary | ICD-10-CM

## 2016-02-22 DIAGNOSIS — J4520 Mild intermittent asthma, uncomplicated: Secondary | ICD-10-CM

## 2016-02-22 DIAGNOSIS — R300 Dysuria: Secondary | ICD-10-CM

## 2016-02-22 NOTE — Assessment & Plan Note (Signed)
Assessment:  She reports using Advair about twice monthly and is not using BID.  Symptoms are well controlled despite this and she is only using the rescue inhaler about three times per month.  She is not having daily or nighttime symptoms.  Likely mild intermittent asthma.  Will "step down" therapy though it she already has stepped down because she has not been using Advair consistently. Plan:  STOP Advair, continue prn Ventolin.  She will let me know if symptoms return or worsen.

## 2016-02-22 NOTE — Patient Instructions (Signed)
1. Please come back to see me for PAP smear next week.   2. Please take all medications as prescribed.  You can stop taking your Advair since your asthma is very well controlled despite using this med only a few times per month.  Please keep refills of your Ventolin on hand so you can use it as needed.  If you start to need your Ventolin more often, develop worsening shortness of breath or wheezing, or have symptoms at night please come back to see [place].   3. If you have worsening of your symptoms or new symptoms arise, please call the clinic [phone]), or go to the ER immediately if symptoms are severe.

## 2016-02-22 NOTE — Assessment & Plan Note (Addendum)
Assessment:  Dysuria, hematuria and vaginal itching in the few days after sex.  This happens after every sexual encounter.  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  She denies vaginal discharge, abnormal bleeding or vaginal ulcers.  She reports monogamous relationship with one female sexual partner but does not wear condoms.  She would like to be checked for STI today but will forgo HIV testing which she had 1 year ago. Plan:  UA and urine GC/CT today; return to clinic next week for pelvic exam, PAP/HPV and additional testing if needed

## 2016-02-22 NOTE — Progress Notes (Signed)
Internal Medicine Clinic Attending  Case discussed with Dr. Wilson soon after the resident saw the patient.  We reviewed the resident's history and exam and pertinent patient test results.  I agree with the assessment, diagnosis, and plan of care documented in the resident's note.  

## 2016-02-22 NOTE — Progress Notes (Signed)
Subjective:    Patient ID: Megan Richardson, female    DOB: 08-02-1976, 40 y.o.   MRN: [id number]  HPI Comments: [name] is a 40 year old woman with PMH as below here for follow-up of asthma.  Please see problem based charting for status of this and other chronic conditions.      Past Medical History  Diagnosis Date  . Asthma     With Exacerbations  . Constipation     SP normal colonoscopy 7/08, GI lebaur Dr. Elicia Lamp.  . Anxiety   . Hepatitis B immune   . Palpitations     Chronic  . GERD (gastroesophageal reflux disease)    Current Outpatient Prescriptions on File Prior to Visit  Medication Sig Dispense Refill  . albuterol (VENTOLIN HFA) 108 (90 BASE) MCG/ACT inhaler Inhale 2 puffs into the lungs every 6 (six) hours as needed for wheezing or shortness of breath. 3 Inhaler 3  . cephALEXin (KEFLEX) 500 MG capsule Take 1 capsule (500 mg total) by mouth 4 (four) times daily. Take all of medicine and drink lots of fluids 20 capsule 0  . Fluticasone-Salmeterol (ADVAIR DISKUS) 250-50 MCG/DOSE AEPB Inhale 1 puff into the lungs every 12 (twelve) hours. (Patient not taking: Reported on 01/01/2016) 60 each 11  . HYDROcodone-acetaminophen (NORCO/VICODIN) 5-325 MG tablet Take 1 tablet by mouth every 6 (six) hours as needed for moderate pain or severe pain. 20 tablet 0  . metroNIDAZOLE (FLAGYL) 500 MG tablet Take 1 tablet (500 mg total) by mouth 2 (two) times daily. (Patient not taking: Reported on 01/01/2016) 14 tablet 0   No current facility-administered medications on file prior to visit.    Review of Systems  Constitutional: Negative for fever, chills and appetite change.  Respiratory: Negative for shortness of breath and wheezing.   Cardiovascular: Negative for chest pain.  Gastrointestinal: Negative for nausea, vomiting, abdominal pain, diarrhea and blood in stool.  Genitourinary: Negative for dysuria, urgency, hematuria and vaginal discharge.       Dysuria, hematuria, vaginal  itching a few days after every sexual encounter.  No dyspaurenia.  Neurological: Negative for syncope and light-headedness.       Filed Vitals:   02/22/16 1048  BP: 120/79  Pulse: 68  Temp: 98.2 F (36.8 C)  TempSrc: Oral  Height: 5\' 7"  (1.702 m)  Weight: 137 lb 12.8 oz (62.506 kg)  SpO2: 100%    Objective:   Physical Exam  Constitutional: She is oriented to person, place, and time. She appears well-developed. No distress.  HENT:  Head: Normocephalic and atraumatic.  Mouth/Throat: Oropharynx is clear and moist. No oropharyngeal exudate.  Eyes: Conjunctivae and EOM are normal. Pupils are equal, round, and reactive to light. Right eye exhibits no discharge. Left eye exhibits no discharge. No scleral icterus.  Neck: Normal range of motion. Neck supple.  Cardiovascular: Normal rate and regular rhythm.  Exam reveals no gallop and no friction rub.   No murmur heard. Pulmonary/Chest: Effort normal and breath sounds normal. No respiratory distress. She has no wheezes. She has no rales.  Abdominal: Soft. Bowel sounds are normal. She exhibits no distension. There is no tenderness. There is no rebound and no guarding.  Musculoskeletal: She exhibits no edema or tenderness.  Neurological: She is alert and oriented to person, place, and time. No cranial nerve deficit.  Skin: Skin is warm. She is not diaphoretic.  Psychiatric: She has a normal mood and affect. Her behavior is normal.  Assessment & Plan:  Please see problem based charting for A&P.

## 2016-02-23 LAB — URINALYSIS, ROUTINE W REFLEX MICROSCOPIC
Bilirubin, UA: NEGATIVE
Glucose, UA: NEGATIVE
KETONES UA: NEGATIVE
LEUKOCYTES UA: NEGATIVE
Nitrite, UA: NEGATIVE
Protein, UA: NEGATIVE
RBC, UA: NEGATIVE
SPEC GRAV UA: 1.024 (ref 1.005–1.030)
Urobilinogen, Ur: 0.2 mg/dL (ref 0.2–1.0)
pH, UA: 6 (ref 5.0–7.5)

## 2016-02-23 LAB — URINE CYTOLOGY ANCILLARY ONLY
Chlamydia: NEGATIVE
Neisseria Gonorrhea: NEGATIVE

## 2016-03-06 ENCOUNTER — Encounter: Payer: Self-pay | Admitting: *Deleted

## 2016-03-07 ENCOUNTER — Encounter: Payer: Self-pay | Admitting: Internal Medicine

## 2016-03-07 ENCOUNTER — Ambulatory Visit: Payer: Self-pay | Admitting: Internal Medicine

## 2016-07-02 ENCOUNTER — Telehealth: Payer: Self-pay | Admitting: Internal Medicine

## 2016-07-02 DIAGNOSIS — J4540 Moderate persistent asthma, uncomplicated: Secondary | ICD-10-CM

## 2016-07-02 NOTE — Telephone Encounter (Signed)
albuterol (VENTOLIN HFA) 108 (90 BASE) MCG/ACT inhaler [facility]

## 2016-07-04 MED ORDER — ALBUTEROL SULFATE HFA 108 (90 BASE) MCG/ACT IN AERS: 2.0000 | INHALATION_SPRAY | Freq: Four times a day (QID) | RESPIRATORY_TRACT | 3 refills | Status: DC | PRN

## 2016-07-04 NOTE — Telephone Encounter (Signed)
Could you please [facility] this [facility] to the [facility]. Thanks!

## 2016-07-04 NOTE — Telephone Encounter (Signed)
Called to pharm to teresa

## 2016-08-28 ENCOUNTER — Telehealth: Payer: Self-pay | Admitting: Internal Medicine

## 2016-08-28 NOTE — Telephone Encounter (Signed)
APT. REMINDER CALL, LMTCB °

## 2016-08-29 ENCOUNTER — Encounter (INDEPENDENT_AMBULATORY_CARE_PROVIDER_SITE_OTHER): Payer: Self-pay

## 2016-08-29 ENCOUNTER — Ambulatory Visit (INDEPENDENT_AMBULATORY_CARE_PROVIDER_SITE_OTHER): Payer: 59 | Admitting: Internal Medicine

## 2016-08-29 ENCOUNTER — Encounter: Payer: Self-pay | Admitting: Internal Medicine

## 2016-08-29 VITALS — BP 117/57 | HR 69 | Temp 98.9°F | Wt 147.9 lb

## 2016-08-29 DIAGNOSIS — R5383 Other fatigue: Secondary | ICD-10-CM

## 2016-08-29 DIAGNOSIS — Z79899 Other long term (current) drug therapy: Secondary | ICD-10-CM | POA: Diagnosis not present

## 2016-08-29 DIAGNOSIS — Z87891 Personal history of nicotine dependence: Secondary | ICD-10-CM

## 2016-08-29 DIAGNOSIS — J4520 Mild intermittent asthma, uncomplicated: Secondary | ICD-10-CM

## 2016-08-29 DIAGNOSIS — Z0000 Encounter for general adult medical examination without abnormal findings: Secondary | ICD-10-CM

## 2016-08-29 MED ORDER — ALBUTEROL SULFATE HFA 108 (90 BASE) MCG/ACT IN AERS: 2.0000 | INHALATION_SPRAY | Freq: Four times a day (QID) | RESPIRATORY_TRACT | 3 refills | Status: DC | PRN

## 2016-08-29 NOTE — Assessment & Plan Note (Addendum)
Patient has mild intermittent asthma and was recently "stepped down" in her therapy from daily controller inhaler to when necessary albuterol. She reports she has not required significant albuterol use over the last 6 months. She reports no significant asthma exacerbations recently. She is breathing well in clinic today and has no complaints. We will continue her albuterol and refill was sent today.  We will order peak flow measurements today in order to follow her disease progression.

## 2016-08-29 NOTE — Progress Notes (Signed)
CC: asthma HPI: [name] is a 40 y.o. female with a h/o of asthma and anxiety who presents for routine follow-up of the above.  Please see Problem-based charting for HPI and the status of patient's chronic medical conditions.  Past Medical History:  Diagnosis Date  . Anxiety   . Asthma    With Exacerbations  . Constipation    SP normal colonoscopy 7/08, GI lebaur Dr. Elicia Lamp.  Marland Kitchen GERD (gastroesophageal reflux disease)   . Hepatitis B immune   . Palpitations    Chronic    Social Hx: She works as a Quarry manager at [employer]. She denies any illicit drug use. She is a nonsmoker.  Review of Systems: ROS in HPI. Otherwise: Review of Systems  Constitutional: Positive for malaise/fatigue. Negative for chills, fever and weight loss.  Respiratory: Negative for cough and shortness of breath.   Cardiovascular: Negative for chest pain and leg swelling.  Gastrointestinal: Negative for abdominal pain, constipation, diarrhea, nausea and vomiting.  Genitourinary: Negative for dysuria, frequency and urgency.  Neurological: Negative for dizziness and headaches.  Psychiatric/Behavioral: The patient is nervous/anxious.     Physical Exam: Vitals:   08/29/16 1541  BP: (!) 117/57  Pulse: 69  Temp: 98.9 F (37.2 C)  TempSrc: Oral  SpO2: 100%  Weight: 147 lb 14.4 oz (67.1 kg)   Physical Exam  Constitutional: She is oriented to person, place, and time. She appears well-developed. She is cooperative. No distress.  Cardiovascular: Normal rate, regular rhythm, normal heart sounds and normal pulses.  Exam reveals no gallop.   No murmur heard. Pulmonary/Chest: Effort normal and breath sounds normal. No respiratory distress. She has no wheezes. She has no rhonchi. She has no rales. Breasts are symmetrical.  Abdominal: Soft. Bowel sounds are normal. There is no tenderness.  Musculoskeletal: She exhibits no edema.  Neurological: She is alert and oriented to person, place,  and time.    Assessment & Plan:  See encounters tab for problem based medical decision making. Patient seen with Dr. Evette Doffing  Asthma Patient has mild intermittent asthma and was recently "stepped down" in her therapy from daily controller inhaler to when necessary albuterol. She reports she has not required significant albuterol use over the last 6 months. She reports no significant asthma exacerbations recently. She is breathing well in clinic today and has no complaints. We will continue her albuterol and refill was sent today.  We will order peak flow measurements today in order to follow her disease progression.  Health care maintenance Patient presents for routine health follow-up, and we have discussed checking her mammogram at age 32, she should develop significant concern for any masses or changes to her progress.  Patient requests Pap smear, but would like a female provider. We will reschedule an appointment with another provider to have this done at patient's convenience.  Fatigue Patient reports feeling significant fatigued in spite of getting adequate sleep. She reports that the symptoms started several months ago but have been worse lately since the spring of this year. She notes she typically goes to bed around 10 PM and gets up around 6:30 AM. She reports multiple awakenings during the night. She has no difficulty falling asleep either at the beginning of the night or throughout the night, but feels that she does not wake up refreshed. We discussed sleep hygiene and she does report using screens including watching that 10 PM news just before bed. She also reports using her bedroom  for multiple other chores and routines prior to going to bed. Significant caffeine or alcohol intake prior to bed and more restless sleep.  There is also some concern that her fatigue may be related to underlying depression. She is a widow since 3 years ago, and reports that she "has a lot of life going  on". She reports that she was tried on Prozac several years ago and did not like how it made her feel. She declines trying pharmacologic therapy at this time. We will continue to screen for changes in her mental health.  Objective screen for other possible metabolic causes of her fatigue including anemia and hypothyroidism. Review of systems she has no other significant signs of hypothyroidism including: Tolerance, changes in appetite, changes to skin, changes to hair. She does however endorse some symptoms may be consistent with pica including ice chewing. We will order TSH and CBC today.   Signed: Holley Raring, MD 08/29/2016, 6:04 PM  Pager: [phone]

## 2016-08-29 NOTE — Assessment & Plan Note (Addendum)
Patient presents for routine health follow-up, and we have discussed checking her mammogram at age 40, she should develop significant concern for any masses or changes to her progress.  Patient requests Pap smear, but would like a female provider. We will reschedule an appointment with another provider to have this done at patient's convenience.

## 2016-08-29 NOTE — Assessment & Plan Note (Signed)
Patient reports feeling significant fatigued in spite of getting adequate sleep. She reports that the symptoms started several months ago but have been worse lately since the spring of this year. She notes she typically goes to bed around 10 PM and gets up around 6:30 AM. She reports multiple awakenings during the night. She has no difficulty falling asleep either at the beginning of the night or throughout the night, but feels that she does not wake up refreshed. We discussed sleep hygiene and she does report using screens including watching that 10 PM news just before bed. She also reports using her bedroom for multiple other chores and routines prior to going to bed. Significant caffeine or alcohol intake prior to bed and more restless sleep.  There is also some concern that her fatigue may be related to underlying depression. She is a widow since 3 years ago, and reports that she "has a lot of life going on". She reports that she was tried on Prozac several years ago and did not like how it made her feel. She declines trying pharmacologic therapy at this time. We will continue to screen for changes in her mental health.  Objective screen for other possible metabolic causes of her fatigue including anemia and hypothyroidism. Review of systems she has no other significant signs of hypothyroidism including: Tolerance, changes in appetite, changes to skin, changes to hair. She does however endorse some symptoms may be consistent with pica including ice chewing. We will order TSH and CBC today.

## 2016-08-29 NOTE — Patient Instructions (Addendum)
Thanks for coming to the clinic today. We will do several blood tests to assess for a cause of your fatigue and sleepiness. I would also recommend working on good "sleep hygiene" to ensure that you are getting the best quality of sleep possible.  We will also refer you for a lung test to follow you asthma. I have refilled your asthma medication and it should be available at the pharmacy.  It was a pleasure to meet you. If you would like to schedule an appointment for a pap smear with another doctor, please do this on your way out today.

## 2016-08-30 LAB — CBC
Hematocrit: 29.2 % — ABNORMAL LOW (ref 34.0–46.6)
Hemoglobin: 9.5 g/dL — ABNORMAL LOW (ref 11.1–15.9)
MCH: 26.5 pg — ABNORMAL LOW (ref 26.6–33.0)
MCHC: 32.5 g/dL (ref 31.5–35.7)
MCV: 81 fL (ref 79–97)
PLATELETS: 335 10*3/uL (ref 150–379)
RBC: 3.59 x10E6/uL — ABNORMAL LOW (ref 3.77–5.28)
RDW: 16.4 % — AB (ref 12.3–15.4)
WBC: 4.7 10*3/uL (ref 3.4–10.8)

## 2016-08-30 LAB — TSH: TSH: 1.05 u[IU]/mL (ref 0.450–4.500)

## 2016-08-30 NOTE — Progress Notes (Signed)
Internal Medicine Clinic Attending  I saw and evaluated the patient.  I personally confirmed the key portions of the history and exam documented by Dr. Strelow and I reviewed pertinent patient test results.  The assessment, diagnosis, and plan were formulated together and I agree with the documentation in the resident's note. 

## 2016-08-31 ENCOUNTER — Telehealth: Payer: Self-pay | Admitting: Internal Medicine

## 2016-08-31 DIAGNOSIS — R5383 Other fatigue: Secondary | ICD-10-CM

## 2016-08-31 DIAGNOSIS — D649 Anemia, unspecified: Secondary | ICD-10-CM

## 2016-08-31 DIAGNOSIS — D509 Iron deficiency anemia, unspecified: Secondary | ICD-10-CM | POA: Insufficient documentation

## 2016-08-31 NOTE — Assessment & Plan Note (Addendum)
Pt complains of worsening fatigue over 6 months. Hgb 9.5 down from 11.2 three years prior. She does endorse some changes to her menstrual bleeding, noting that she has been using more pads than usual since September 2017. Her periods are regular and typically last 5 days, and she is not able to quantify how may pads she uses, but estimates 1 - 1.5 packs of pads per cycle. She denies blood in her stool. No significant changes to her diet. Appears microcytive w/ MCV 81 and RDW slightly elevated. Will plan evaluation with Fe Studies and FOBT. Plan to supplement according to results. Will need pelvic exam for evaluation of uterine bleeding and may need [place] for evaluation of fibroids.

## 2016-08-31 NOTE — Telephone Encounter (Signed)
  Reason for call:   I placed an outgoing call to [name] at 9:15 AM regarding her lab results.   Assessment/ Plan:   Pt was notified about her Hgb of 9.5. We discussed the likelihood that this was exacerbating her symptoms of fatigue. We also discussed further workup options such as Iron Studies and FOBT testing. She has agreed to proceed with this w/u at her next visit in [facility] where she was previously planning to have a Pap Smear and Pelvic exam.  Pt denies any blood in her stool. But does endorse some increased bleeding with her menstrual cycle. She reports that her periods typically last 5 days and she has been using more pads than normal for the last 2 months.  As always, pt is advised that if symptoms worsen or new symptoms arise, they should go to an urgent care facility or to to ER for further evaluation.   Megan Raring, MD   08/31/2016, 9:11 AM

## 2016-08-31 NOTE — Assessment & Plan Note (Signed)
Likely exacerbated by worsening anemia. See Problem based charting for "Anemia" for further eval and management.

## 2016-09-02 NOTE — Telephone Encounter (Signed)
Thanks [place]. I would focus on checking a ferritin. If confirms iron deficiency anemia you can supplement with once daily Nu-iron 150 mg. Then work on the menorrhagia, would try an OCP first, could refer to gyn for IUD which are probably the most effective. You don't need to rule out fibroids unless there is unusual bleeding between menstrual periods. You don't need an FOBT unless there is a strong family history of colon cancer. Would recheck CBC within three months to monitor.

## 2016-09-06 ENCOUNTER — Other Ambulatory Visit (INDEPENDENT_AMBULATORY_CARE_PROVIDER_SITE_OTHER): Payer: 59

## 2016-09-06 DIAGNOSIS — D649 Anemia, unspecified: Secondary | ICD-10-CM | POA: Diagnosis not present

## 2016-09-07 LAB — ANEMIA PROFILE B
BASOS: 1 %
Basophils Absolute: 0 10*3/uL (ref 0.0–0.2)
EOS (ABSOLUTE): 0.1 10*3/uL (ref 0.0–0.4)
EOS: 1 %
FERRITIN: 6 ng/mL — AB (ref 15–150)
Folate: 7.5 ng/mL (ref >3.0)
HEMATOCRIT: 32.1 % — AB (ref 34.0–46.6)
HEMOGLOBIN: 10.2 g/dL — AB (ref 11.1–15.9)
IMMATURE GRANS (ABS): 0 10*3/uL (ref 0.0–0.1)
IRON SATURATION: 7 % — AB (ref 15–55)
Immature Granulocytes: 0 %
Iron: 37 ug/dL (ref 27–159)
LYMPHS: 37 %
Lymphocytes Absolute: 2.1 10*3/uL (ref 0.7–3.1)
MCH: 26.8 pg (ref 26.6–33.0)
MCHC: 31.8 g/dL (ref 31.5–35.7)
MCV: 84 fL (ref 79–97)
MONOS ABS: 0.3 10*3/uL (ref 0.1–0.9)
Monocytes: 6 %
Neutrophils Absolute: 3.1 10*3/uL (ref 1.4–7.0)
Neutrophils: 55 %
Platelets: 523 10*3/uL — ABNORMAL HIGH (ref 150–379)
RBC: 3.81 x10E6/uL (ref 3.77–5.28)
RDW: 16.1 % — ABNORMAL HIGH (ref 12.3–15.4)
RETIC CT PCT: 1.5 % (ref 0.6–2.6)
Total Iron Binding Capacity: 511 ug/dL — ABNORMAL HIGH (ref 250–450)
UIBC: 474 ug/dL — ABNORMAL HIGH (ref 131–425)
Vitamin B-12: 404 pg/mL (ref 232–1245)
WBC: 5.7 10*3/uL (ref 3.4–10.8)

## 2016-09-09 ENCOUNTER — Ambulatory Visit: Payer: 59

## 2016-09-09 ENCOUNTER — Ambulatory Visit (INDEPENDENT_AMBULATORY_CARE_PROVIDER_SITE_OTHER): Payer: 59 | Admitting: Pulmonary Disease

## 2016-09-09 DIAGNOSIS — S161XXA Strain of muscle, fascia and tendon at neck level, initial encounter: Secondary | ICD-10-CM

## 2016-09-09 DIAGNOSIS — R222 Localized swelling, mass and lump, trunk: Secondary | ICD-10-CM

## 2016-09-09 DIAGNOSIS — IMO0002 Reserved for concepts with insufficient information to code with codable children: Secondary | ICD-10-CM

## 2016-09-09 DIAGNOSIS — D649 Anemia, unspecified: Secondary | ICD-10-CM

## 2016-09-09 DIAGNOSIS — M542 Cervicalgia: Secondary | ICD-10-CM | POA: Diagnosis not present

## 2016-09-09 DIAGNOSIS — R229 Localized swelling, mass and lump, unspecified: Secondary | ICD-10-CM

## 2016-09-09 DIAGNOSIS — D500 Iron deficiency anemia secondary to blood loss (chronic): Secondary | ICD-10-CM

## 2016-09-09 MED ORDER — POLYSACCHARIDE IRON COMPLEX 150 MG PO CAPS: 150.0000 mg | ORAL_CAPSULE | Freq: Every day | ORAL | 2 refills | Status: DC

## 2016-09-09 MED ORDER — IBUPROFEN 600 MG PO TABS: 600.0000 mg | ORAL_TABLET | Freq: Four times a day (QID) | ORAL | 0 refills | Status: DC | PRN

## 2016-09-09 NOTE — Patient Instructions (Addendum)
We will monitor the knot on your neck for now. If it grows or develops new symptoms, let us know.  NECK PAIN OVERVIEW - Neck pain can be caused by a number of factors, including muscle strain, ligament sprains, arthritis, or a "pinched" nerve. Approximately 10 percent of adults have neck pain at any one time. The majority of patients, regardless of the cause of pain, recover with conservative therapy.  This topic review discusses the causes, evaluation, and available treatments for the most common types of neck pain.  Cervical strain - Cervical muscle strain can occur when there is an injury to the muscles of the neck, causing spasm of the cervical and upper back muscles. Cervical strain may result from the physical stresses of everyday life, including poor posture, muscle tension from psychologic stress, or poor sleeping habits. The mechanism of injury in sports concussions can also result in cervical strain. Typically, cervical strain symptoms include pain, stiffness, and tightness in the upper back or shoulder, which may last for up to six weeks.  NECK PAIN TREATMENTS - In most cases, neck pain can be treated conservatively with over-the-counter pain medications, ice, heat and massage, and strengthening and/or stretching exercises at home. If pain does not improve after a few weeks of conservative treatment, further evaluation is usually recommended.  Pain relief - Acetaminophen (eg, Tylenol and others) or a nonsteroidal antiinflammatory medication (eg, ibuprofen, naproxen) may be helpful to relieve mild to moderate neck pain.   Ice - For some people, ice can reduce the severity of neck pain. It can be applied directly to the sore area of the neck. Ice can be frozen in a paper cup, and then the upper edge of the cup can be torn away. The ice should be moved continuously in strokes on the neck muscles for five to seven minutes.  To control sudden onset muscle tightness, place a bag of ice, bag of  frozen peas, or a frozen towel wrapped in a dry towel, on the painful area. The ice should be left in place for 15 to 20 minutes to deeply penetrate the tissues; this can be repeated every two to four hours until symptoms improve.  The skin can become injured with excessive use of ice, especially in those with poor skin sensation. The skin should be inspected with each ice application for changes in pigmentation (eg, lighter or darker colored skin) and any changes should be reported to a healthcare provider.  Heat - Heat can help to reduce pain in the neck muscles. Moist heat can be applied for 10 to 15 minutes in a shower, hot bath, or with a moist towel warmed in a microwave. However, acute injuries should utilize ice as the initial treatment. Heat may be used initially for patients who have cold intolerance to ice.  It is important to avoid overheating the towel and potentially injuring the skin, especially in people with poor skin sensation. The skin can become pigmented (discolored) in a blotchy pattern in people who use heat frequently.  Massage - Massage can be helpful for relieving muscle spasm and can be performed after heating or icing the neck. Massage can be done with the hands by applying pressure to both sides of the neck and the upper back muscles, or with an electric hand-held vibrator. The neck muscles should be relaxed during massage by supporting the head or lying down.  Stretching exercises - The range of motion of the neck must be restored and preserved after an injury;  this is done with exercises that stretch and strengthen the neck muscles. Range of motion exercises and stretching may help decrease pain from muscle injury. It is best to perform stretching exercises when the muscles are warm, such as after the application of heat, or after a few minutes of cardiovascular warm-up exercises.  Exercises can be performed in the morning to relieve stiffness and again at night before  going to bed. Expect mild, achy muscle pain; sharp or electric pain in the shoulder or arm is not normal and should be reported to a healthcare provider.  Do not attempt to perform these exercises if you have a pinched nerve in the neck, especially if there is pain or numbness into the arm and hand, unless recommended by a healthcare provider. While these exercises can dramatically improve symptoms of a pinched nerve, they can actually make the problem worse if performed improperly.  The most useful stretching exercises for the neck include the following:  Neck bending - Tilt the head forward and try to touch your chin to your neck. Hold for a few seconds, breathe in gradually, and exhale slowly with each exercise. Exhaling with the movement helps relax the muscles. Repeat 10 to 15 times. Relax the neck and back muscles with each neck bend.  Shoulder rolls - In the sitting or standing position, hold the arms at the side with the elbows bent. Try to pinch the shoulder blades together. Roll the shoulders backwards 10 to 15 times, moving in a rhythmic, rowing motion. Rest. Roll the shoulders forwards 10 to 15 times.  Other exercise may include neck rotation, neck tilting, vertical shoulder stretches, upper back stretches, and back bending.  Neck rotation - Slowly look to the right. Hold for a few seconds. Look to the center. Rest for a few seconds between movements. Repeat 10 to 15 times. Perform on the left side.  Neck tilting - Look straight forward, then tilt the top of the head to the right, trying to touch your right ear to the right shoulder (without moving the shoulder). Hold in place for a few seconds. Return the head to the center. Repeat 10 to 15 times. Repeat on the left side.  Vertical shoulder stretches - In the sitting or standing position, use the right hand to hold the left wrist and pull the arm (and shoulder) up and over the head, towards the right. Hold for five seconds. Keep the left  shoulder and back muscles relaxed. Rest and repeat 10 to 15 times. Repeat using left hand to hold right wrist.  Upper back stretches - In the standing position, lean forward from the hips and rest both hands on a low counter with the elbows straight. Exhale, relax the neck and shoulders, and allow the head to fall forward as you round the upper back. This requires the shoulder blades to spread apart and mimics the motion of a cat stretching its back. Exhaling with the motion helps to relax the muscles. Return to the standing position with hands on a counter. Repeat slowly 10 times.  Upper back side bends - Stand or sit up straight in a chair. Bend the trunk to the right while holding the hands together slightly behind the neck for support. Hold for five seconds, and then return to center. Repeat to the left. Repeat 10 to 15 times. Keep the lower back straight or supported against a chair.  Reduce stress - Emotional stress can increase neck tension and interfere with or delay the  recovery process. Reducing stress may help to prevent a recurrence of neck pain. Relaxation techniques can relieve musculoskeletal tension. An example of a relaxation exercise is to take a deep breath in, hold it for a few seconds, and then exhale completely. Breathe normally for a few seconds, and then repeat.  Other activities that may help to reduce stress include meditation, progressive muscle relaxation, prayer, self-hypnosis, or biofeedback.  Posture - Activities and body positions that prevent or reduce neck pain include those that emphasize a neutral neck position and minimize tension across the supporting muscles and ligaments of the neck. Extremes of range of motion, activities, and body positions that cause constant tension should be minimized or avoided:  Avoid sitting in the same position for prolonged periods of time. Take periodic five minute breaks from the desk, work station, etc. Avoid looking up or down at a  computer monitor; adjust it to eye level.  Avoid placing pressure over the upper back with backpacks, over-the-shoulder purses, or children riding on your shoulders. Alternatives may include wheeled backpacks or cases, handbags, and having a child walk or ride in a stroller.  Do not perform overhead work for prolonged periods at a time.  Maintain good posture by holding your head up and keeping your shoulders back and down.  Use the car or chair arm rests to keep the arms supported.  Sleep with your neck in a neutral position by sleeping with a small pillow under the nape of your neck (sleeping on your back) or sleeping with enough pillows to keep your neck straight in line with your body (sleeping on your side). If you sleep on your back, putting a pillow under the knees can help to flatten the spine and relax the neck muscles. Avoid sleeping on the stomach with the head turned.  Carry heavy objects close to your body rather than with outstretched arms.

## 2016-09-09 NOTE — Progress Notes (Signed)
   CC: neck pain  HPI:  [name] is a 40 y.o. woman with history as noted below presenting with neck pain.  She noticed a knot a month ago. A week later, the muscle on the same side started hurting. She does look downwards a lot. It is unchanged. She has tried massaging it and also tried heat pads. Feels like a cramp in her neck. Radiates down her shoulder. No arm weakness. No paresthesias   Past Medical History:  Diagnosis Date  . Anxiety   . Asthma    With Exacerbations  . Constipation    SP normal colonoscopy 7/08, GI lebaur Dr. Elicia Lamp.  Marland Kitchen GERD (gastroesophageal reflux disease)   . Hepatitis B immune   . Palpitations    Chronic    Review of Systems:   No fevers or chills No chest pain  Physical Exam:  Vitals:   09/09/16 1603  BP: 113/74  Pulse: 72  Temp: 98.3 F (36.8 C)  TempSrc: Oral  SpO2: 100%  Weight: 144 lb (65.3 kg)  Height: 5\' 7"  (1.702 m)   General Apperance: NAD HEENT: Normocephalic, atraumatic, anicteric sclera Neck: Supple, trachea midline. Mild tenderness to palpation along left trapezius muscle. 6mm subcutaneous round firm mobile lesion about 1cm left of cervical spine that is nontender. Lungs: Clear to auscultation bilaterally.  Heart: Regular rate and rhythm Abdomen: Soft, nontender, nondistended, no rebound/guarding Extremities: Warm and well perfused, no edema Skin: No rashes or lesions Neurologic: Alert and interactive. No gross deficits.  Assessment & Plan:   See Encounters Tab for problem based charting.  Patient discussed with Dr. Dareen Piano

## 2016-09-10 DIAGNOSIS — IMO0002 Reserved for concepts with insufficient information to code with codable children: Secondary | ICD-10-CM | POA: Insufficient documentation

## 2016-09-10 DIAGNOSIS — S161XXA Strain of muscle, fascia and tendon at neck level, initial encounter: Secondary | ICD-10-CM | POA: Insufficient documentation

## 2016-09-10 DIAGNOSIS — R229 Localized swelling, mass and lump, unspecified: Secondary | ICD-10-CM

## 2016-09-10 NOTE — Assessment & Plan Note (Signed)
Assessment: Exam and history most consistent with cervical strain. She does have poor posture at work. No arm weakness or paresthesias.  Plan: Ibuprofen 600mg  q6hr prn pain Continue heat/ice Stretching exercises Instructions for improved posture given

## 2016-09-10 NOTE — Progress Notes (Signed)
Internal Medicine Clinic Attending  Case discussed with Dr. Krall at the time of the visit.  We reviewed the resident's history and exam and pertinent patient test results.  I agree with the assessment, diagnosis, and plan of care documented in the resident's note.  

## 2016-09-10 NOTE — Assessment & Plan Note (Signed)
68mm lump about 1cm left of cervical spine on neck that is firm and mobile. May represent a cyst. Nontender. Will monitor for now as it appears benign.

## 2016-09-10 NOTE — Assessment & Plan Note (Signed)
Started patient on Nu Iron 150mg  daily as her ferritin was 6. Recheck CBC within 3 months.

## 2016-10-28 ENCOUNTER — Ambulatory Visit: Payer: 59

## 2016-11-04 ENCOUNTER — Ambulatory Visit: Payer: 59

## 2016-12-23 ENCOUNTER — Encounter (INDEPENDENT_AMBULATORY_CARE_PROVIDER_SITE_OTHER): Payer: Self-pay

## 2016-12-23 ENCOUNTER — Ambulatory Visit (INDEPENDENT_AMBULATORY_CARE_PROVIDER_SITE_OTHER): Payer: 59 | Admitting: Internal Medicine

## 2016-12-23 ENCOUNTER — Encounter: Payer: Self-pay | Admitting: Internal Medicine

## 2016-12-23 VITALS — BP 119/75 | HR 73 | Temp 98.4°F | Ht 67.0 in | Wt 152.7 lb

## 2016-12-23 DIAGNOSIS — Z01419 Encounter for gynecological examination (general) (routine) without abnormal findings: Secondary | ICD-10-CM | POA: Diagnosis not present

## 2016-12-23 DIAGNOSIS — K598 Other specified functional intestinal disorders: Secondary | ICD-10-CM

## 2016-12-23 DIAGNOSIS — D509 Iron deficiency anemia, unspecified: Secondary | ICD-10-CM | POA: Diagnosis not present

## 2016-12-23 MED ORDER — POLYSACCHARIDE IRON COMPLEX 150 MG PO CAPS: 150.0000 mg | ORAL_CAPSULE | Freq: Every day | ORAL | 5 refills | Status: DC

## 2016-12-23 NOTE — Patient Instructions (Addendum)
General Instructions: - Restart oral iron therapy, Nu-iron 150 mg daily - You may get constipation from the iron so make sure to continue your laxatives. You may also notice dark stool. - Will let you know results of pap smear within the next week  Please bring your medicines with you each time you come to clinic.  Medicines may include prescription medications, over-the-counter medications, herbal remedies, eye drops, vitamins, or other pills.   Progress Toward Treatment Goals:  Treatment Goal 04/14/2014  Stop smoking smoking less    Self Care Goals & Plans:  Self Care Goal 04/14/2014  Manage my medications take my medicines as prescribed; bring my medications to every visit; refill my medications on time  Eat healthy foods drink diet soda or water instead of juice or soda; eat more vegetables; eat foods that are low in salt; eat baked foods instead of fried foods; eat fruit for snacks and desserts; eat smaller portions  Be physically active find an activity I enjoy  Meeting treatment goals maintain the current self-care plan    No flowsheet data found.   Care Management & Community Referrals:  Referral 04/14/2014  Referrals made to community resources none

## 2016-12-23 NOTE — Progress Notes (Signed)
   CC: Appointment for Pap smear  HPI:  [name] is a 41 y.o. woman with past mental history as noted below who presents today for Pap smear.  Pap smear: Her last Pap smear was in February 2016 which was negative for any malignant cells. Her HPV testing was positive, but was negative for high risk HPV. She is requesting to be tested for STDs today. She reports being with one partner over the last year. She denies any vaginal discharge, foul odor, or itching.  Iron deficiency anemia: She was first noted to have a low normal hemoglobin in September 2014 of 11.2. At her appointment on 08/29/2016 she had complained of fatigue for the last 6 months and was found to have a decreased hemoglobin of 9.5. Iron studies showed that she was iron deficient with a ferritin of 6. She was started on iron therapy. She reports that she has not taken iron for a couple months now as this causes constipation. 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 She has had prior colonoscopy and upper endoscopy due to her history of constipation in 2008. These studies did not show any evidence for a bleeding source. She had a repeat upper endoscopy in August 2015 for complaint of global sensation. This endoscopy was normal and again did not show any evidence of a bleeding source. She denies heavy menstrual bleeding. She reports her periods typically last 3-5 days and she uses 3-4 tampons per day. She denies any melena or hematochezia. She denies any dizziness or fatigue at this time.  Past Medical History:  Diagnosis Date  . Anxiety   . Asthma    With Exacerbations  . Constipation    SP normal colonoscopy 7/08, GI lebaur Dr. Elicia Lamp.  Marland Kitchen GERD (gastroesophageal reflux disease)   . Hepatitis B immune   . Palpitations    Chronic    Review of Systems:  Review of Systems  Constitutional: Negative for chills, fever, malaise/fatigue and weight loss.  HENT: Negative  for congestion, sinus pain and sore throat.   Eyes: Negative for blurred vision, photophobia and redness.  Respiratory: Negative for cough, sputum production and shortness of breath.   Cardiovascular: Negative for chest pain, palpitations and leg swelling.  Gastrointestinal: Positive for constipation. Negative for abdominal pain, blood in stool, diarrhea, melena, nausea and vomiting.  Genitourinary: Negative for dysuria, frequency and hematuria.  Musculoskeletal: Negative for myalgias.  Skin: Negative for rash.  Neurological: Negative for dizziness, tingling, sensory change, focal weakness, weakness and headaches.  Endo/Heme/Allergies: Does not bruise/bleed easily.  Psychiatric/Behavioral: Negative for depression. The patient is not nervous/anxious.     Physical Exam:  Vitals:   12/23/16 1536  BP: 119/75  Pulse: 73  Temp: 98.4 F (36.9 C)  TempSrc: Oral  SpO2: 100%  Weight: 152 lb 11.2 oz (69.3 kg)  Height: 5\' 7"  (1.702 m)   General: well-nourished young woman, NAD GU: Normal appearing external genitalia. She has a small skin tag on the right lower labia majora. Cervix and vaginal canal appear normal.   Assessment & Plan:   See Encounters Tab for problem based charting.  Patient discussed with Dr. Dareen Piano

## 2016-12-24 ENCOUNTER — Other Ambulatory Visit: Payer: Self-pay | Admitting: Internal Medicine

## 2016-12-24 DIAGNOSIS — D509 Iron deficiency anemia, unspecified: Secondary | ICD-10-CM

## 2016-12-24 LAB — CBC
HEMATOCRIT: 32.7 % — AB (ref 34.0–46.6)
HEMOGLOBIN: 10.7 g/dL — AB (ref 11.1–15.9)
MCH: 28.2 pg (ref 26.6–33.0)
MCHC: 32.7 g/dL (ref 31.5–35.7)
MCV: 86 fL (ref 79–97)
Platelets: 425 10*3/uL — ABNORMAL HIGH (ref 150–379)
RBC: 3.8 x10E6/uL (ref 3.77–5.28)
RDW: 16.4 % — AB (ref 12.3–15.4)
WBC: 6.2 10*3/uL (ref 3.4–10.8)

## 2016-12-24 LAB — FERRITIN: FERRITIN: 7 ng/mL — AB (ref 15–150)

## 2016-12-24 LAB — HIV ANTIBODY (ROUTINE TESTING W REFLEX): HIV Screen 4th Generation wRfx: NONREACTIVE

## 2016-12-24 MED ORDER — POLYSACCHARIDE IRON COMPLEX 150 MG PO CAPS: 150.0000 mg | ORAL_CAPSULE | Freq: Every day | ORAL | 5 refills | Status: DC

## 2016-12-24 NOTE — Assessment & Plan Note (Signed)
Pap smear was performed today. Will follow-up cytology results. Testing was also done for HIV, GC/chlamydia.

## 2016-12-24 NOTE — Assessment & Plan Note (Signed)
The etiology for her iron deficiency anemia is unclear. She does not seem to have heavy menstrual bleeding as would expect in a young woman. She has had both upper endoscopy and colonoscopy for other reasons than bleeding, but the studies did not show any evidence of a bleeding source. Celiac disease could be another cause for iron deficiency anemia, however she has a history of constipation and colonoscopy would've shown some villous blunting. Per the patient's request I rechecked a CBC and ferritin level today. Advised her to restart iron therapy and set a new prescription to her pharmacy. She will likely require imaging of her uterus to determine if she has fibroids or another source for bleeding.

## 2016-12-25 ENCOUNTER — Telehealth: Payer: Self-pay | Admitting: Internal Medicine

## 2016-12-25 LAB — CYTOLOGY - PAP
ADEQUACY: ABSENT
CHLAMYDIA, DNA PROBE: NEGATIVE
Diagnosis: NEGATIVE
NEISSERIA GONORRHEA: NEGATIVE

## 2016-12-25 NOTE — Telephone Encounter (Signed)
Discussed pap smear and lab results with patient. All questions answered satisfactorily.

## 2016-12-25 NOTE — Progress Notes (Signed)
Internal Medicine Clinic Attending  Case discussed with Dr. Rivet at the time of the visit.  We reviewed the resident's history and exam and pertinent patient test results.  I agree with the assessment, diagnosis, and plan of care documented in the resident's note.  

## 2017-01-02 NOTE — Telephone Encounter (Signed)
Helen can you please close this enounter °

## 2017-02-14 ENCOUNTER — Encounter (HOSPITAL_COMMUNITY): Payer: Self-pay | Admitting: Emergency Medicine

## 2017-02-14 ENCOUNTER — Emergency Department (HOSPITAL_COMMUNITY)
Admission: EM | Admit: 2017-02-14 | Discharge: 2017-02-14 | Disposition: A | Discharge status: Home / Self Care | Payer: 59 | Attending: Emergency Medicine | Admitting: Emergency Medicine

## 2017-02-14 DIAGNOSIS — J45909 Unspecified asthma, uncomplicated: Secondary | ICD-10-CM | POA: Insufficient documentation

## 2017-02-14 DIAGNOSIS — Z87891 Personal history of nicotine dependence: Secondary | ICD-10-CM | POA: Diagnosis not present

## 2017-02-14 DIAGNOSIS — R1010 Upper abdominal pain, unspecified: Secondary | ICD-10-CM | POA: Diagnosis present

## 2017-02-14 DIAGNOSIS — K2970 Gastritis, unspecified, without bleeding: Secondary | ICD-10-CM | POA: Insufficient documentation

## 2017-02-14 LAB — CBC
HEMATOCRIT: 38.2 % (ref 36.0–46.0)
HEMOGLOBIN: 12.4 g/dL (ref 12.0–15.0)
MCH: 30.2 pg (ref 26.0–34.0)
MCHC: 32.5 g/dL (ref 30.0–36.0)
MCV: 92.9 fL (ref 78.0–100.0)
Platelets: 320 10*3/uL (ref 150–400)
RBC: 4.11 MIL/uL (ref 3.87–5.11)
RDW: 17.3 % — ABNORMAL HIGH (ref 11.5–15.5)
WBC: 4.3 10*3/uL (ref 4.0–10.5)

## 2017-02-14 LAB — COMPREHENSIVE METABOLIC PANEL
ALK PHOS: 61 U/L (ref 38–126)
ALT: 18 U/L (ref 14–54)
ANION GAP: 11 (ref 5–15)
AST: 18 U/L (ref 15–41)
Albumin: 3.9 g/dL (ref 3.5–5.0)
BILIRUBIN TOTAL: 0.4 mg/dL (ref 0.3–1.2)
BUN: 8 mg/dL (ref 6–20)
CALCIUM: 8.7 mg/dL — AB (ref 8.9–10.3)
CO2: 20 mmol/L — ABNORMAL LOW (ref 22–32)
Chloride: 109 mmol/L (ref 101–111)
Creatinine, Ser: 0.56 mg/dL (ref 0.44–1.00)
Glucose, Bld: 80 mg/dL (ref 65–99)
POTASSIUM: 3.2 mmol/L — AB (ref 3.5–5.1)
Sodium: 140 mmol/L (ref 135–145)
TOTAL PROTEIN: 7.5 g/dL (ref 6.5–8.1)

## 2017-02-14 LAB — URINALYSIS, ROUTINE W REFLEX MICROSCOPIC
BILIRUBIN URINE: NEGATIVE
Glucose, UA: NEGATIVE mg/dL
Hgb urine dipstick: NEGATIVE
KETONES UR: NEGATIVE mg/dL
LEUKOCYTES UA: NEGATIVE
NITRITE: NEGATIVE
PH: 5 (ref 5.0–8.0)
Protein, ur: NEGATIVE mg/dL
SPECIFIC GRAVITY, URINE: 1.021 (ref 1.005–1.030)

## 2017-02-14 LAB — I-STAT BETA HCG BLOOD, ED (MC, WL, AP ONLY)

## 2017-02-14 LAB — LIPASE, BLOOD: Lipase: 36 U/L (ref 11–51)

## 2017-02-14 MED ORDER — ONDANSETRON HCL 4 MG/2ML IJ SOLN: 4.0000 mg | Freq: Once | INTRAMUSCULAR | Status: DC

## 2017-02-14 MED ORDER — ONDANSETRON HCL 4 MG/2ML IJ SOLN
4.0000 mg | Freq: Once | INTRAMUSCULAR | Status: AC | PRN
  Administered 2017-02-14: 4 mg via INTRAVENOUS
  Filled 2017-02-14: qty 2

## 2017-02-14 MED ORDER — GI COCKTAIL ~~LOC~~
30.0000 mL | Freq: Once | ORAL | Status: AC
  Administered 2017-02-14: 30 mL via ORAL
  Filled 2017-02-14: qty 30

## 2017-02-14 MED ORDER — LORAZEPAM 2 MG/ML IJ SOLN
0.5000 mg | Freq: Once | INTRAMUSCULAR | Status: AC
  Administered 2017-02-14: 0.5 mg via INTRAVENOUS
  Filled 2017-02-14: qty 1

## 2017-02-14 MED ORDER — PANTOPRAZOLE SODIUM 20 MG PO TBEC: 20.0000 mg | DELAYED_RELEASE_TABLET | Freq: Every day | ORAL | 0 refills | Status: DC

## 2017-02-14 MED ORDER — SODIUM CHLORIDE 0.9 % IV BOLUS (SEPSIS)
1000.0000 mL | Freq: Once | INTRAVENOUS | Status: AC
  Administered 2017-02-14: 1000 mL via INTRAVENOUS

## 2017-02-14 MED ORDER — POTASSIUM CHLORIDE CRYS ER 20 MEQ PO TBCR
20.0000 meq | EXTENDED_RELEASE_TABLET | Freq: Once | ORAL | Status: AC
  Administered 2017-02-14: 20 meq via ORAL
  Filled 2017-02-14: qty 1

## 2017-02-14 MED ORDER — PANTOPRAZOLE SODIUM 40 MG IV SOLR
40.0000 mg | Freq: Once | INTRAVENOUS | Status: AC
  Administered 2017-02-14: 40 mg via INTRAVENOUS
  Filled 2017-02-14: qty 40

## 2017-02-14 NOTE — ED Triage Notes (Signed)
Patient states around 4am this morning she was woke up by LUQ pain and nausea. Patient denies diarrhea or urinary problems.

## 2017-02-14 NOTE — ED Provider Notes (Signed)
[facility] DEPT Provider Note   CSN: [id number] Arrival date & time: 02/14/17  0756     History   Chief Complaint Chief Complaint  Patient presents with  . Abdominal Pain  . Nausea    HPI Megan Richardson is a 41 y.o. female.  She awoke this morning with upper abdominal burning discomfort, with decreased appetite but no nausea or vomiting.  She denies fever, chills, myalgia, rhinorrhea, cough, diarrhea.  She occasionally gets constipated but had a bowel movement yesterday.  She denies dysuria or urinary frequency.  No similar problems and no known sick exposures.  There are no other known modifying factors.  HPI  Past Medical History:  Diagnosis Date  . Anxiety   . Asthma    With Exacerbations  . Constipation    SP normal colonoscopy 7/08, GI lebaur Dr. Elicia Lamp.  Marland Kitchen GERD (gastroesophageal reflux disease)   . Hepatitis B immune   . Palpitations    Chronic    Patient Active Problem List   Diagnosis Date Noted  . Cervical strain, acute 09/10/2016  . Lump 09/10/2016  . Iron deficiency anemia 08/31/2016  . Fatigue 08/29/2016  . Dysuria 02/22/2016  . Gardnerella vaginalis infection 06/22/2015  . HIV exposure 03/06/2015  . Bronchial asthma 02/28/2015  . Encounter for cervical Pap smear with pelvic exam 11/16/2014  . Vaginal discharge 11/16/2014  . UTI (urinary tract infection), uncomplicated 83/15/1761  . Health care maintenance 11/16/2014  . Globus sensation 04/25/2014  . GERD (gastroesophageal reflux disease) 04/14/2014  . Hyperlipidemia 08/30/2011  . Breast mass, right 02/11/2011  . THROAT PAIN, CHRONIC 07/02/2010  . ANXIETY STATE, UNSPECIFIED 07/25/2008  . PALPITATIONS, CHRONIC 05/17/2008  . Asthma 01/18/2008  . CONSTIPATION, CHRONIC 01/18/2008    Past Surgical History:  Procedure Laterality Date  . CESAREAN SECTION     Multiple  . Tubal ligation  2006    OB History    No data available       Home Medications    Prior to Admission  medications   Medication Sig Start Date End Date Taking? Authorizing Provider  albuterol (VENTOLIN HFA) 108 (90 Base) MCG/ACT inhaler Inhale 2 puffs into the lungs every 6 (six) hours as needed for wheezing or shortness of breath. 08/29/16   Holley Raring, MD  ibuprofen (ADVIL,MOTRIN) 600 MG tablet Take 1 tablet (600 mg total) by mouth every 6 (six) hours as needed. 09/09/16   Milagros Loll, MD  iron polysaccharides (NU-IRON) 150 MG capsule Take 1 capsule (150 mg total) by mouth daily. 12/24/16   Rivet, Sindy Guadeloupe, MD  pantoprazole (PROTONIX) 20 MG tablet Take 1 tablet (20 mg total) by mouth daily. 02/14/17   Daleen Bo, MD    Family History Family History  Problem Relation Age of Onset  . BRCA 1/2 Maternal Grandmother   . Cancer Maternal Aunt        thoat   . Cancer Maternal Grandfather        lung    Social History Social History  Substance Use Topics  . Smoking status: Former Smoker    Packs/day: 0.10    Types: Cigarettes  . Smokeless tobacco: Never Used     Comment: has cut down.  . Alcohol use 0.0 oz/week     Comment: Only socially.      Allergies   Patient has no known allergies.   Review of Systems Review of Systems  All other systems reviewed and are negative.    Physical Exam Updated  Vital Signs BP 115/78 (BP Location: Left Arm)   Pulse 94   Temp 98.3 F (36.8 C) (Oral)   Resp 20   Ht '5\' 7"'  (1.702 m)   Wt 68 kg (150 lb)   LMP 12/24/2016   SpO2 100%   BMI 23.49 kg/m   Physical Exam  Constitutional: She is oriented to person, place, and time. She appears well-developed and well-nourished. She appears distressed (Uncomfortable, groaning).  HENT:  Head: Normocephalic and atraumatic.  Eyes: Conjunctivae and EOM are normal. Pupils are equal, round, and reactive to light.  Neck: Normal range of motion and phonation normal. Neck supple.  Cardiovascular: Normal rate and regular rhythm.   Pulmonary/Chest: Effort normal and breath sounds normal. She  exhibits no tenderness.  Abdominal: Soft. She exhibits no distension. There is no tenderness. There is no rebound and no guarding.  Musculoskeletal: Normal range of motion.  Neurological: She is alert and oriented to person, place, and time. She exhibits normal muscle tone.  Skin: Skin is warm and dry.  Psychiatric: She has a normal mood and affect. Her behavior is normal. Judgment and thought content normal.  Nursing note and vitals reviewed.    ED Treatments / Results  Labs (all labs ordered are listed, but only abnormal results are displayed) Labs Reviewed  COMPREHENSIVE METABOLIC PANEL - Abnormal; Notable for the following:       Result Value   Potassium 3.2 (*)    CO2 20 (*)    Calcium 8.7 (*)    All other components within normal limits  CBC - Abnormal; Notable for the following:    RDW 17.3 (*)    All other components within normal limits  URINALYSIS, ROUTINE W REFLEX MICROSCOPIC - Abnormal; Notable for the following:    APPearance HAZY (*)    All other components within normal limits  LIPASE, BLOOD  I-STAT BETA HCG BLOOD, ED ([facility], [facility], [facility] ONLY)    EKG  EKG Interpretation None       Radiology No results found.  Procedures Procedures (including critical care time)  Medications Ordered in ED Medications  ondansetron (ZOFRAN) injection 4 mg (4 mg Intravenous Given 02/14/17 0852)  gi cocktail (Maalox,Lidocaine,Donnatal) (30 mLs Oral Given 02/14/17 0852)  sodium chloride 0.9 % bolus 1,000 mL (0 mLs Intravenous Stopped 02/14/17 1004)  pantoprazole (PROTONIX) injection 40 mg (40 mg Intravenous Given 02/14/17 0852)  LORazepam (ATIVAN) injection 0.5 mg (0.5 mg Intravenous Given 02/14/17 0851)  potassium chloride SA (K-DUR,KLOR-CON) CR tablet 20 mEq (20 mEq Oral Given 02/14/17 1005)     Initial Impression / Assessment and Plan / ED Course  I have reviewed the triage vital signs and the nursing notes.  Pertinent labs & imaging results that were available during my care of  the patient were reviewed by me and considered in my medical decision making (see chart for details).  Clinical Course as of Feb 15 1044  Fri Feb 14, 2017  0823 Patient with benign abdominal exam, complaining of burning discomfort in her upper abdomen, most likely gastritis.  History of anxiety.  Treat symptomatically, obtain labs then reassess.  [EW]    Clinical Course User Index [EW] Daleen Bo, MD     Patient Vitals for the past 24 hrs:  BP Temp Temp src Pulse Resp SpO2 Height Weight  02/14/17 0804 115/78 98.3 F (36.8 C) Oral 94 20 100 % '5\' 7"'  (1.702 m) 68 kg (150 lb)    10:45 AM Reevaluation with update and discussion. After  initial assessment and treatment, an updated evaluation reveals she is comfortable and has been able to tolerate oral liquids and potassium.  Findings discussed with the patient and all questions answered. Rashawn Rolon L    Final Clinical Impressions(s) / ED Diagnoses   Final diagnoses:  Gastritis without bleeding, unspecified chronicity, unspecified gastritis type   Evaluation consistent with gastritis/peptic ulcer disease, and possible esophagitis.  Doubt hemodynamic instability or metabolic instability.  Doubt gallbladder disease, or pancreatitis.  Incidental low potassium is nonspecific and unexpected to continue as she has no overt fluid losses.  No indication for prescription potassium at this time.  Nursing Notes Reviewed/ Care Coordinated Applicable Imaging Reviewed Interpretation of Laboratory Data incorporated into ED treatment  The patient appears reasonably screened and/or stabilized for discharge and I doubt any other medical condition or other [facility] requiring further screening, evaluation, or treatment in the ED at this time prior to discharge.  Plan: Home Medications-PP 1 month supplement with antacid orally continue usual medications; Home Treatments-rest, fluids gradually advance diet; return here if the recommended treatment, does not  improve the symptoms; Recommended follow up-PCP or GI for follow-up treatment within 1 or 2 weeks.   New Prescriptions New Prescriptions   PANTOPRAZOLE (PROTONIX) 20 MG TABLET    Take 1 tablet (20 mg total) by mouth daily.     Daleen Bo, MD 02/14/17 1046

## 2017-02-14 NOTE — Discharge Instructions (Signed)
The testing today was reassuring.  Your discomfort is likely caused by acid, either stomach or esophageal.  Treatments include a medicine like Protonix to decrease secretion of gastric acid, and antacids, such as my plan or Maalox.  Protonix takes 1-2 weeks to begin to work, and should be taken for at least one month.  To help control symptoms you can take a liquid antacid before meals and at bedtime, 2 tablespoons.  Follow-up with your primary care doctor, or GI service, for the treatment as needed.

## 2017-02-28 ENCOUNTER — Encounter: Payer: Self-pay | Admitting: *Deleted

## 2017-04-08 ENCOUNTER — Other Ambulatory Visit (HOSPITAL_COMMUNITY)
Admission: RE | Admit: 2017-04-08 | Discharge: 2017-04-08 | Disposition: A | Discharge status: Home / Self Care | Payer: 59 | Source: Ambulatory Visit | Attending: Internal Medicine | Admitting: Internal Medicine

## 2017-04-08 ENCOUNTER — Ambulatory Visit (INDEPENDENT_AMBULATORY_CARE_PROVIDER_SITE_OTHER): Payer: 59 | Admitting: Internal Medicine

## 2017-04-08 VITALS — BP 122/77 | HR 102 | Temp 98.3°F | Wt 160.3 lb

## 2017-04-08 DIAGNOSIS — R300 Dysuria: Secondary | ICD-10-CM | POA: Insufficient documentation

## 2017-04-08 DIAGNOSIS — N949 Unspecified condition associated with female genital organs and menstrual cycle: Secondary | ICD-10-CM | POA: Insufficient documentation

## 2017-04-08 DIAGNOSIS — Z87891 Personal history of nicotine dependence: Secondary | ICD-10-CM | POA: Diagnosis not present

## 2017-04-08 LAB — POCT URINALYSIS DIPSTICK
Bilirubin, UA: NEGATIVE
Blood, UA: NEGATIVE
GLUCOSE UA: NEGATIVE
Ketones, UA: NEGATIVE
LEUKOCYTES UA: NEGATIVE
NITRITE UA: NEGATIVE
Protein, UA: NEGATIVE
Spec Grav, UA: 1.02 (ref 1.010–1.025)
Urobilinogen, UA: 0.2 E.U./dL
pH, UA: 7 (ref 5.0–8.0)

## 2017-04-08 NOTE — Assessment & Plan Note (Signed)
Assessment:  Dysuria Patient reports a three-week history of dysuria with little benefit of Keflex and ciprofloxacin. She has associated symptoms of increased urinary frequency.  She states she is sexually active with the same partner for the past 1.5 years. Urine dipstick in the office was negative for leukocytes, nitrites or blood. A wet prep and GC/chlamydia was obtained.      Plan -Urine dipstick -Pelvic exam checking for GC/chlamydia, BV, candidiasis and trichomonas

## 2017-04-08 NOTE — Patient Instructions (Signed)
Ms. Gaber,  It was a pleasure meeting you. I will call you with the results of your tests.

## 2017-04-08 NOTE — Progress Notes (Signed)
   CC: Dysuria  HPI:  Megan Richardson is a 41 y.o. female with history noted below that presents to the acute care clinic for 3 week history of dysuria. She states her symptoms started after swimming in a pool. She reports going to urgent care and being started on Keflex. After several days of Keflex with minimal benefit patient received a course of ciprofloxacin. Patient states that with ciprofloxacin her pain diminished however she continues to have urinary urgency and dysuria. Patient denies hematuria, fever/chills, flank pain, nausea or vomiting.  Past Medical History:  Diagnosis Date  . Anxiety   . Asthma    With Exacerbations  . Constipation    SP normal colonoscopy 7/08, GI lebaur Dr. Elicia Lamp.  Marland Kitchen GERD (gastroesophageal reflux disease)   . Hepatitis B immune   . Palpitations    Chronic    Review of Systems:  As noted per history of present illness  Physical Exam:  Vitals:   04/08/17 1548  BP: 122/77  Pulse: (!) 102  Temp: 98.3 F (36.8 C)  TempSrc: Oral  SpO2: 99%  Weight: 160 lb 4.8 oz (72.7 kg)   Physical Exam  Pulmonary/Chest: Effort normal.  Genitourinary: Cervix normal. Vaginal discharge found.  Genitourinary Comments: Labia appeared normal, no erythema noted or skin changes  Musculoskeletal: She exhibits no edema.  Skin: Skin is warm and dry.     Assessment & Plan:   See encounters tab for problem based medical decision making.   Patient discussed with Dr. Daryll Drown

## 2017-04-09 LAB — CERVICOVAGINAL ANCILLARY ONLY
Bacterial vaginitis: NEGATIVE
Candida vaginitis: NEGATIVE
Chlamydia: NEGATIVE
NEISSERIA GONORRHEA: NEGATIVE
Trichomonas: NEGATIVE

## 2017-04-10 ENCOUNTER — Other Ambulatory Visit: Payer: Self-pay

## 2017-04-10 MED ORDER — POLYSACCHARIDE IRON COMPLEX 150 MG PO CAPS: 150.0000 mg | ORAL_CAPSULE | Freq: Every day | ORAL | 3 refills | Status: DC

## 2017-04-10 NOTE — Telephone Encounter (Signed)
iron polysaccharides (NU-IRON) 150 MG capsule, REFILL REQUEST ON [facility] [address].

## 2017-04-12 NOTE — Progress Notes (Signed)
Internal Medicine Clinic Attending  Case discussed with Dr. Hoffman at the time of the visit.  We reviewed the resident's history and exam and pertinent patient test results.  I agree with the assessment, diagnosis, and plan of care documented in the resident's note.  

## 2017-07-17 ENCOUNTER — Encounter: Payer: 59 | Admitting: Internal Medicine

## 2017-11-10 NOTE — Progress Notes (Signed)
   CC: Follow up on iron deficiency anemia  HPI:  Megan Richardson is a 42 y.o. female with history noted below that presents to the Internal Medicine Clinic for follow up on iron deficiency anemia.  Please see problem based charting for the status of patient's chronic medical conditions.  Past Medical History:  Diagnosis Date  . Anxiety   . Asthma    With Exacerbations  . Constipation    SP normal colonoscopy 7/08, GI lebaur Dr. Elicia Lamp.  Marland Kitchen GERD (gastroesophageal reflux disease)   . Hepatitis B immune   . Palpitations    Chronic    Review of Systems:  Review of Systems  Constitutional: Positive for malaise/fatigue.  Respiratory: Negative for shortness of breath.   Cardiovascular: Negative for chest pain.  Gastrointestinal: Negative for nausea and vomiting.  Musculoskeletal: Negative for falls.     Physical Exam:  Vitals:   11/13/17 1553  BP: 119/83  Pulse: 72  Temp: 98.2 F (36.8 C)  TempSrc: Oral  SpO2: 100%  Weight: 164 lb 1.6 oz (74.4 kg)  Height: 5\' 7"  (1.702 m)   Physical Exam  Constitutional: She is well-developed, well-nourished, and in no distress.  Eyes: Conjunctivae are normal.  Cardiovascular: Normal rate, regular rhythm and normal heart sounds. Exam reveals no gallop and no friction rub.  No murmur heard. Pulmonary/Chest: Effort normal and breath sounds normal. No respiratory distress. She has no wheezes. She has no rales.    Assessment & Plan:   See encounters tab for problem based medical decision making.     Patient discussed with Dr. Dareen Piano

## 2017-11-13 ENCOUNTER — Ambulatory Visit (INDEPENDENT_AMBULATORY_CARE_PROVIDER_SITE_OTHER): Payer: 59 | Admitting: Internal Medicine

## 2017-11-13 ENCOUNTER — Encounter: Payer: Self-pay | Admitting: Internal Medicine

## 2017-11-13 VITALS — BP 119/83 | HR 72 | Temp 98.2°F | Ht 67.0 in | Wt 164.1 lb

## 2017-11-13 DIAGNOSIS — F458 Other somatoform disorders: Secondary | ICD-10-CM

## 2017-11-13 DIAGNOSIS — D509 Iron deficiency anemia, unspecified: Secondary | ICD-10-CM

## 2017-11-13 DIAGNOSIS — J4520 Mild intermittent asthma, uncomplicated: Secondary | ICD-10-CM | POA: Diagnosis not present

## 2017-11-13 DIAGNOSIS — R0989 Other specified symptoms and signs involving the circulatory and respiratory systems: Secondary | ICD-10-CM

## 2017-11-13 DIAGNOSIS — D500 Iron deficiency anemia secondary to blood loss (chronic): Secondary | ICD-10-CM | POA: Diagnosis not present

## 2017-11-13 DIAGNOSIS — R198 Other specified symptoms and signs involving the digestive system and abdomen: Secondary | ICD-10-CM

## 2017-11-13 DIAGNOSIS — N920 Excessive and frequent menstruation with regular cycle: Secondary | ICD-10-CM

## 2017-11-13 DIAGNOSIS — F17210 Nicotine dependence, cigarettes, uncomplicated: Secondary | ICD-10-CM | POA: Diagnosis not present

## 2017-11-13 DIAGNOSIS — Z0000 Encounter for general adult medical examination without abnormal findings: Secondary | ICD-10-CM

## 2017-11-13 NOTE — Patient Instructions (Signed)
Megan Richardson,  It was a pleasure seeing you today.  Please follow up in 6 months.  I will call you with the results of your test.

## 2017-11-14 LAB — FERRITIN: FERRITIN: 7 ng/mL — AB (ref 15–150)

## 2017-11-14 LAB — CBC
Hematocrit: 33.1 % — ABNORMAL LOW (ref 34.0–46.6)
Hemoglobin: 10.7 g/dL — ABNORMAL LOW (ref 11.1–15.9)
MCH: 28.2 pg (ref 26.6–33.0)
MCHC: 32.3 g/dL (ref 31.5–35.7)
MCV: 87 fL (ref 79–97)
Platelets: 473 10*3/uL — ABNORMAL HIGH (ref 150–379)
RBC: 3.8 x10E6/uL (ref 3.77–5.28)
RDW: 15.3 % (ref 12.3–15.4)
WBC: 5.6 10*3/uL (ref 3.4–10.8)

## 2017-11-14 LAB — HIV ANTIBODY (ROUTINE TESTING W REFLEX): HIV Screen 4th Generation wRfx: NONREACTIVE

## 2017-11-14 LAB — TSH: TSH: 1.03 u[IU]/mL (ref 0.450–4.500)

## 2017-11-16 NOTE — Assessment & Plan Note (Signed)
Assessment:  Healthcare screeing Has one partner and has had unprotected sex.  Concerned about partner and would like HIV screening today.  Plan -HIV screening

## 2017-11-16 NOTE — Assessment & Plan Note (Signed)
Assessment:  Globus sensation  Patient reports a history of globus sensation for many years, but has recently progressivley gotten worse.  She has been evaluated in the past by ENT.  She describes the sensation as a choking feeling.  Patient states that food and water goes down ok.  Has tried omeprazole and is currently on it without benefit.  She reports a history of smoking, on and off for 30 years, 1/2 pack a day.  Will refer back to ENT  Plan -ENT

## 2017-11-16 NOTE — Assessment & Plan Note (Signed)
Assessment:  Mild intermittent asthma Well controlled.  She uses her inhaler 4 times a month.   Plan -continue albuterol

## 2017-11-16 NOTE — Assessment & Plan Note (Addendum)
Assessment:  History of Iron deficiency anemia 2/2 menorrhagia Patient's ferritin was 7 on 12/2016.  She currently does not take iron due to upset stomach.  In the past she would take 1 iron supplement pill daily.  She states she feels more tired than usual.     Plan -cbc and ferritin -TSH  Addendum:  Ferritin 7, ordered IV iron

## 2017-11-17 ENCOUNTER — Telehealth: Payer: Self-pay | Admitting: *Deleted

## 2017-11-17 ENCOUNTER — Telehealth: Payer: Self-pay | Admitting: Internal Medicine

## 2017-11-17 NOTE — Telephone Encounter (Signed)
Patient has been informed of this appt.

## 2017-11-17 NOTE — Progress Notes (Signed)
Internal Medicine Clinic Attending  Case discussed with Dr. Hoffman at the time of the visit.  We reviewed the resident's history and exam and pertinent patient test results.  I agree with the assessment, diagnosis, and plan of care documented in the resident's note.  

## 2017-11-17 NOTE — Telephone Encounter (Signed)
Per Dr. Magdalene River- ordered 2 IV iron infusions. Appt has been scheduled at [facility] for 11/25/17 @ 8am (this is the earliest they have). LM on pt's vm to call us back to inform her of this appt.

## 2017-11-17 NOTE — Telephone Encounter (Signed)
Discussed results of CBC with patient.  Told patient IV iron was ordered.

## 2017-11-18 ENCOUNTER — Encounter: Payer: Self-pay | Admitting: Internal Medicine

## 2017-11-18 ENCOUNTER — Telehealth: Payer: Self-pay | Admitting: Internal Medicine

## 2017-11-18 NOTE — Telephone Encounter (Signed)
Pt is wanting physician to call her back, she has some more questions

## 2017-11-24 ENCOUNTER — Telehealth: Payer: Self-pay | Admitting: Internal Medicine

## 2017-11-24 NOTE — Telephone Encounter (Signed)
Thanks.  Called her back and discussed questions

## 2017-11-24 NOTE — Telephone Encounter (Signed)
Returned patients call.  Patient had questions about iron deficiency anemia that we discussed.  She is going for IV iron transfusion tomorrow am.  Also confirmed her ENT referral.

## 2017-11-25 ENCOUNTER — Encounter (HOSPITAL_COMMUNITY)
Admission: RE | Admit: 2017-11-25 | Discharge: 2017-11-25 | Disposition: A | Discharge status: Home / Self Care | Payer: 59 | Source: Ambulatory Visit | Attending: Internal Medicine | Admitting: Internal Medicine

## 2017-11-25 DIAGNOSIS — D509 Iron deficiency anemia, unspecified: Secondary | ICD-10-CM | POA: Insufficient documentation

## 2017-11-25 MED ORDER — FERUMOXYTOL INJECTION 510 MG/17 ML
510.0000 mg | INTRAVENOUS | Status: DC
  Administered 2017-11-25: 510 mg via INTRAVENOUS
  Filled 2017-11-25: qty 17

## 2017-11-25 NOTE — Discharge Instructions (Signed)

## 2017-12-02 ENCOUNTER — Encounter (HOSPITAL_COMMUNITY)
Admission: RE | Admit: 2017-12-02 | Discharge: 2017-12-02 | Disposition: A | Discharge status: Home / Self Care | Payer: 59 | Source: Ambulatory Visit | Attending: Internal Medicine | Admitting: Internal Medicine

## 2017-12-02 DIAGNOSIS — D509 Iron deficiency anemia, unspecified: Secondary | ICD-10-CM | POA: Insufficient documentation

## 2017-12-02 MED ORDER — SODIUM CHLORIDE 0.9 % IV SOLN
510.0000 mg | INTRAVENOUS | Status: AC
  Administered 2017-12-02: 510 mg via INTRAVENOUS
  Filled 2017-12-02: qty 17

## 2018-01-20 ENCOUNTER — Telehealth: Payer: Self-pay

## 2018-01-20 NOTE — Telephone Encounter (Signed)
Called line busy.

## 2018-01-20 NOTE — Telephone Encounter (Signed)
Pt states she has felt great since the last iron infusion until the last few days, she is feeling a little tired. What she would like is to come in for labs and then either wait for her next appt w/ dr Heber [place] or if labs dont look good come in then but not before labs. Please advise either labs or appt/ stat labs?

## 2018-01-20 NOTE — Telephone Encounter (Signed)
Requesting to speak with a nurse about getting a blood work for iron level. Pt states she is feeling tired and would like an iron level check. Please call pt back.

## 2018-01-21 NOTE — Telephone Encounter (Signed)
I would advise to come into the [facility] to get evaluated and get lab work

## 2018-01-21 NOTE — Telephone Encounter (Signed)
Patient notified of below. Unable to schedule appt at this time as she does not know when she can next take off work. She will call back to schedule as she is able. Hubbard Hartshorn, RN, BSN  '

## 2018-03-02 ENCOUNTER — Other Ambulatory Visit: Payer: Self-pay

## 2018-03-02 ENCOUNTER — Ambulatory Visit (INDEPENDENT_AMBULATORY_CARE_PROVIDER_SITE_OTHER): Payer: 59 | Admitting: Internal Medicine

## 2018-03-02 VITALS — BP 119/81 | HR 61 | Temp 98.3°F | Ht 67.0 in | Wt 163.6 lb

## 2018-03-02 DIAGNOSIS — N920 Excessive and frequent menstruation with regular cycle: Secondary | ICD-10-CM

## 2018-03-02 DIAGNOSIS — D500 Iron deficiency anemia secondary to blood loss (chronic): Secondary | ICD-10-CM

## 2018-03-02 DIAGNOSIS — D509 Iron deficiency anemia, unspecified: Secondary | ICD-10-CM

## 2018-03-02 DIAGNOSIS — Z0000 Encounter for general adult medical examination without abnormal findings: Secondary | ICD-10-CM

## 2018-03-02 NOTE — Assessment & Plan Note (Signed)
She had a mammogram in 2015 due to concern for right breast mass which was BI-RADS Category 1.  She is requesting referral for repeat mammogram. A/P: Repeat mammogram

## 2018-03-02 NOTE — Patient Instructions (Addendum)
It was a pleasure to see you [name].  We are checking blood work to assess your blood count and iron level.  We will contact you with the results and if we need to arrange for further iron transfusions.  I am referring you for a mammogram.  Please follow up with [place] in 6 months or sooner if needed.

## 2018-03-02 NOTE — Assessment & Plan Note (Addendum)
She has a history of iron deficiency anemia secondary to menorrhagia.  She had a ferritin of 7 on 10/2017 and transfused with Feraheme twice in March 2019.  She reports responding well to the iron transfusion until about 1 week ago when she is began to feel more fatigued around lunchtime and after work.  She says this is a similar sensation prior to her iron transfusions.  She reports heavier menses over the last 2 months.  She is not aware of any history of fibroids.  She denies any obvious bleeding.  She reports cravings for ice.  She denies any new difficulty with sleep.  She says she is taking over-the-counter oral ferrous sulfate. A/P: Patient with symptomatic iron deficiency anemia likely secondary to menorrhagia.  Will recheck CBC and ferritin.  If low, will consider medical therapy to slow menses in addition to iron replacement.  ADDENDUM: Ferritin and Hemoglobin within normal limits; she responded appropriately to feraheme infusions. Discussed results with patient by phone. TSH was normal in 10/2017. Consider further assessment of sleep hygiene, PHQ-9 if symptoms persist.

## 2018-03-02 NOTE — Progress Notes (Addendum)
   CC: iron deficiency  HPI:  [name] is a 42 y.o. female with PMH as listed below who presents for follow up management of iron deficiency anemia.  Please see problem based charting for status of patient's chronic medical issues.  Iron deficiency anemia She has a history of iron deficiency anemia secondary to menorrhagia.  She had a ferritin of 7 on 10/2017 and transfused with Feraheme twice in March 2019.  She reports responding well to the iron transfusion until about 1 week ago when she is began to feel more fatigued around lunchtime and after work.  She says this is a similar sensation prior to her iron transfusions.  She reports heavier menses over the last 2 months.  She is not aware of any history of fibroids.  She denies any obvious bleeding.  She reports cravings for ice.  She denies any new difficulty with sleep.  She says she is taking over-the-counter oral ferrous sulfate. A/P: Patient with symptomatic iron deficiency anemia likely secondary to menorrhagia.  Will recheck CBC and ferritin.  If low, will consider medical therapy to slow menses in addition to iron replacement.  ADDENDUM: Ferritin and Hemoglobin within normal limits; she responded appropriately to feraheme infusions. Discussed results with patient by phone. TSH was normal in 10/2017. Consider further assessment of sleep hygiene, PHQ-9 if symptoms persist.  Health care maintenance She had a mammogram in 2015 due to concern for right breast mass which was BI-RADS Category 1.  She is requesting referral for repeat mammogram. A/P: Repeat mammogram    Past Medical History:  Diagnosis Date  . Anxiety   . Asthma    With Exacerbations  . Constipation    SP normal colonoscopy 7/08, GI lebaur Dr. Elicia Lamp.  Marland Kitchen GERD (gastroesophageal reflux disease)   . Hepatitis B immune   . Palpitations    Chronic   Review of Systems:   Review of Systems  Constitutional: Positive for malaise/fatigue. Negative for chills  and fever.  Respiratory: Negative for shortness of breath.   Cardiovascular: Negative for chest pain.  Gastrointestinal: Negative for blood in stool and melena.  Genitourinary: Negative for hematuria.       Heavy menses  Musculoskeletal: Negative for falls.  Neurological: Negative for dizziness and loss of consciousness.     Physical Exam:  Vitals:   03/02/18 1326  BP: 119/81  Pulse: 61  Temp: 98.3 F (36.8 C)  TempSrc: Oral  SpO2: 100%  Weight: 163 lb 9.6 oz (74.2 kg)  Height: 5\' 7"  (1.702 m)   Physical Exam  Constitutional: She is oriented to person, place, and time. She appears well-developed and well-nourished. No distress.  HENT:  Head: Normocephalic and atraumatic.  Eyes: Conjunctivae are normal.  Cardiovascular: Normal rate and regular rhythm.  No murmur heard. Pulmonary/Chest: Effort normal. No respiratory distress. She has no wheezes. She has no rales.  Musculoskeletal: She exhibits no edema.  Neurological: She is alert and oriented to person, place, and time.  Skin: She is not diaphoretic.     Assessment & Plan:   See Encounters Tab for problem based charting.  Patient discussed with Dr. Evette Doffing

## 2018-03-03 LAB — FERRITIN: Ferritin: 69 ng/mL (ref 15–150)

## 2018-03-03 LAB — CBC
HEMOGLOBIN: 12.5 g/dL (ref 11.1–15.9)
Hematocrit: 37.7 % (ref 34.0–46.6)
MCH: 31.8 pg (ref 26.6–33.0)
MCHC: 33.2 g/dL (ref 31.5–35.7)
MCV: 96 fL (ref 79–97)
Platelets: 403 10*3/uL (ref 150–450)
RBC: 3.93 x10E6/uL (ref 3.77–5.28)
RDW: 13.8 % (ref 12.3–15.4)
WBC: 5.7 10*3/uL (ref 3.4–10.8)

## 2018-03-03 NOTE — Progress Notes (Signed)
Internal Medicine Clinic Attending  Case discussed with Dr. Patel at the time of the visit.  We reviewed the resident's history and exam and pertinent patient test results.  I agree with the assessment, diagnosis, and plan of care documented in the resident's note.  

## 2018-04-14 ENCOUNTER — Ambulatory Visit
Admission: RE | Admit: 2018-04-14 | Discharge: 2018-04-14 | Disposition: A | Discharge status: Home / Self Care | Payer: 59 | Source: Ambulatory Visit | Attending: Student in an Organized Health Care Education/Training Program | Admitting: Student in an Organized Health Care Education/Training Program

## 2018-04-14 DIAGNOSIS — Z0000 Encounter for general adult medical examination without abnormal findings: Secondary | ICD-10-CM

## 2018-04-16 ENCOUNTER — Other Ambulatory Visit: Payer: Self-pay | Admitting: Student in an Organized Health Care Education/Training Program

## 2018-04-16 DIAGNOSIS — R928 Other abnormal and inconclusive findings on diagnostic imaging of breast: Secondary | ICD-10-CM

## 2018-04-22 ENCOUNTER — Ambulatory Visit
Admission: RE | Admit: 2018-04-22 | Discharge: 2018-04-22 | Disposition: A | Discharge status: Home / Self Care | Payer: 59 | Source: Ambulatory Visit | Attending: Student in an Organized Health Care Education/Training Program | Admitting: Student in an Organized Health Care Education/Training Program

## 2018-04-22 ENCOUNTER — Ambulatory Visit: Payer: 59

## 2018-04-22 DIAGNOSIS — R928 Other abnormal and inconclusive findings on diagnostic imaging of breast: Secondary | ICD-10-CM

## 2018-08-21 ENCOUNTER — Other Ambulatory Visit: Payer: Self-pay

## 2018-08-21 ENCOUNTER — Encounter (HOSPITAL_COMMUNITY): Payer: Self-pay

## 2018-08-21 ENCOUNTER — Emergency Department (HOSPITAL_COMMUNITY)
Admission: EM | Admit: 2018-08-21 | Discharge: 2018-08-21 | Disposition: A | Discharge status: Home / Self Care | Payer: BLUE CROSS/BLUE SHIELD | Attending: Emergency Medicine | Admitting: Emergency Medicine

## 2018-08-21 DIAGNOSIS — Z79899 Other long term (current) drug therapy: Secondary | ICD-10-CM | POA: Diagnosis not present

## 2018-08-21 DIAGNOSIS — F419 Anxiety disorder, unspecified: Secondary | ICD-10-CM | POA: Insufficient documentation

## 2018-08-21 DIAGNOSIS — Z87891 Personal history of nicotine dependence: Secondary | ICD-10-CM | POA: Diagnosis not present

## 2018-08-21 DIAGNOSIS — R51 Headache: Secondary | ICD-10-CM | POA: Diagnosis present

## 2018-08-21 DIAGNOSIS — J45909 Unspecified asthma, uncomplicated: Secondary | ICD-10-CM | POA: Insufficient documentation

## 2018-08-21 DIAGNOSIS — H6001 Abscess of right external ear: Secondary | ICD-10-CM | POA: Insufficient documentation

## 2018-08-21 DIAGNOSIS — L0291 Cutaneous abscess, unspecified: Secondary | ICD-10-CM

## 2018-08-21 MED ORDER — HYDROCODONE-ACETAMINOPHEN 5-325 MG PO TABS: 1.0000 | ORAL_TABLET | ORAL | 0 refills | Status: DC | PRN

## 2018-08-21 MED ORDER — LIDOCAINE HCL (PF) 2 % IJ SOLN
10.0000 mL | Freq: Once | INTRAMUSCULAR | Status: AC
  Administered 2018-08-21: 10 mL
  Filled 2018-08-21: qty 10

## 2018-08-21 NOTE — Discharge Instructions (Addendum)
Use a warm moist compress on the sore area 3 or 4 times a day for 30 minutes.  Follow-up with the doctor of your choice if not better, in 3 to 4 days.

## 2018-08-21 NOTE — ED Triage Notes (Addendum)
Pt reports she got a piercing on her right ear a little while back. Pt noticed that it started getting irritated and took the piercing out. Pt reports pain, swelling, and redness for a few days. Pt states that she did notice some drainage from the ear and now reports sore throat this morning. Pt is on Augmentin x3 days to prevent infection. Pt reports trouble hearing out of ear as well.

## 2018-08-21 NOTE — ED Notes (Signed)
ED Provider at bedside. 

## 2018-08-21 NOTE — ED Provider Notes (Signed)
Megan Richardson Provider Note   CSN: [id number] Arrival date & time: 08/21/18  4098     History   Chief Complaint Chief Complaint  Patient presents with  . Otalgia    HPI Megan Richardson is a 42 y.o. female.  HPI   She complains of swelling in the right ear where she had a piercing.  The patient was taken out because of localized swelling and pain, about 4 days ago.  At that time she was started on Augmentin.  She continues to have pain and swelling in the area.  She is unable to hear out of the right ear at this time.  She has had scant purulent drainage from the affected area, and she desires a procedure to drain the pus out.  She denies fever, chills, nausea or vomiting.  There are no other known modifying factors.  Past Medical History:  Diagnosis Date  . Anxiety   . Asthma    With Exacerbations  . Constipation    SP normal colonoscopy 7/08, GI lebaur Dr. Elicia Richardson.  Marland Kitchen GERD (gastroesophageal reflux disease)   . Hepatitis B immune   . Palpitations    Chronic    Patient Active Problem List   Diagnosis Date Noted  . Cervical strain, acute 09/10/2016  . Lump 09/10/2016  . Iron deficiency anemia 08/31/2016  . Fatigue 08/29/2016  . Dysuria 02/22/2016  . Gardnerella vaginalis infection 06/22/2015  . HIV exposure 03/06/2015  . Bronchial asthma 02/28/2015  . Encounter for cervical Pap smear with pelvic exam 11/16/2014  . Vaginal discharge 11/16/2014  . UTI (urinary tract infection), uncomplicated 11/91/4782  . Health care maintenance 11/16/2014  . Globus sensation 04/25/2014  . GERD (gastroesophageal reflux disease) 04/14/2014  . Hyperlipidemia 08/30/2011  . Breast mass, right 02/11/2011  . THROAT PAIN, CHRONIC 07/02/2010  . ANXIETY STATE, UNSPECIFIED 07/25/2008  . PALPITATIONS, CHRONIC 05/17/2008  . Asthma 01/18/2008  . CONSTIPATION, CHRONIC 01/18/2008    Past Surgical History:  Procedure Laterality Date  . CESAREAN SECTION      Multiple  . Tubal ligation  2006     OB History   None      Home Medications    Prior to Admission medications   Medication Sig Start Date End Date Taking? Authorizing Provider  albuterol (VENTOLIN HFA) 108 (90 Base) MCG/ACT inhaler Inhale 2 puffs into the lungs every 6 (six) hours as needed for wheezing or shortness of breath. 08/29/16  Yes Holley Raring, MD  amoxicillin-clavulanate (AUGMENTIN) 500-125 MG tablet Take 500 mg by mouth every 8 (eight) hours. 7 day course 08/19/18  Yes [provider]  HYDROcodone-acetaminophen (NORCO) 5-325 MG tablet Take 1 tablet by mouth every 4 (four) hours as needed for moderate pain. 08/21/18   Daleen Bo, MD  ibuprofen (ADVIL,MOTRIN) 600 MG tablet Take 1 tablet (600 mg total) by mouth every 6 (six) hours as needed. Patient not taking: Reported on 08/21/2018 09/09/16   Milagros Loll, MD  iron polysaccharides (NU-IRON) 150 MG capsule Take 1 capsule (150 mg total) by mouth daily. Patient not taking: Reported on 08/21/2018 04/10/17   Kalman Shan Ratliff, DO  pantoprazole (PROTONIX) 20 MG tablet Take 1 tablet (20 mg total) by mouth daily. Patient not taking: Reported on 08/21/2018 02/14/17   Daleen Bo, MD    Family History Family History  Problem Relation Age of Onset  . BRCA 1/2 Maternal Grandmother 80  . Cancer Maternal Aunt  thoat   . Cancer Maternal Grandfather        lung    Social History Social History   Tobacco Use  . Smoking status: Former Smoker    Packs/day: 0.10    Types: Cigarettes  . Smokeless tobacco: Never Used  Substance Use Topics  . Alcohol use: Yes    Alcohol/week: 0.0 standard drinks    Comment: Only socially.   . Drug use: No     Allergies   Patient has no known allergies.   Review of Systems Review of Systems  All other systems reviewed and are negative.    Physical Exam Updated Vital Signs BP 123/83 (BP Location: Right Arm)   Pulse 64   Temp 98 F (36.7 C)  (Oral)   Resp 18   SpO2 99%   Physical Exam  Constitutional: She is oriented to person, place, and time. She appears well-developed and well-nourished.  HENT:  Head: Normocephalic and atraumatic.  Swelling and tenderness, right tragus region, auditory canals occluded by the swelling.  No significant erythema or drainage is noted.  Eyes: Pupils are equal, round, and reactive to light. Conjunctivae and EOM are normal.  Neck: Normal range of motion and phonation normal. Neck supple.  Cardiovascular: Normal rate.  Pulmonary/Chest: Effort normal.  Musculoskeletal: Normal range of motion.  Neurological: She is alert and oriented to person, place, and time. She exhibits normal muscle tone.  Skin: Skin is warm and dry.  Psychiatric: She has a normal mood and affect. Her behavior is normal. Judgment and thought content normal.  Nursing note and vitals reviewed.    ED Treatments / Results  Labs (all labs ordered are listed, but only abnormal results are displayed) Labs Reviewed - No data to display  EKG None  Radiology No results found.  Procedures .Marland KitchenIncision and Drainage Date/Time: 08/21/2018 10:17 AM Performed by: Daleen Bo, MD Authorized by: Daleen Bo, MD   Consent:    Consent obtained:  Verbal   Consent given by:  Patient   Risks discussed:  Bleeding, incomplete drainage and pain   Alternatives discussed:  No treatment Location:    Type:  Abscess   Size:  1 cm   Location: Tragus, right auricle. Pre-procedure details:    Skin preparation:  Betadine Anesthesia (see MAR for exact dosages):    Anesthesia method:  Local infiltration   Local anesthetic:  Lidocaine 2% w/o epi Procedure type:    Complexity:  Simple Procedure details:    Needle aspiration: no     Incision types:  Stab incision   Incision depth:  Subcutaneous   Scalpel blade:  11   Wound management:  Probed and deloculated   Drainage:  Purulent   Drainage amount:  Scant   Wound treatment:   Wound left open   Packing materials:  None Post-procedure details:    Patient tolerance of procedure:  Tolerated well, no immediate complications   (including critical care time)  Medications Ordered in ED Medications  lidocaine (XYLOCAINE) 2 % injection 10 mL (10 mLs Other Given 08/21/18 0758)     Initial Impression / Assessment and Plan / ED Course  I have reviewed the triage vital signs and the nursing notes.  Pertinent labs & imaging results that were available during my care of the patient were reviewed by me and considered in my medical decision making (see chart for details).      Patient Vitals for the past 24 hrs:  BP Temp Temp src Pulse Resp SpO2  08/21/18 0723 123/83 98 F (36.7 C) Oral 64 18 99 %    10:18 AM Reevaluation with update and discussion. After initial assessment and treatment, an updated evaluation reveals she has mild ear pain.  Findings discussed questions answered. Daleen Bo   Medical Decision Making: Abscess right tragus, secondary to piercing, with secondary infection.  Abscess opened, with some drainage of pus and blood.  Symptomatically improved afterwards.  Doubt systemic, auditory canal, middle ear or deep tissue infection.  CRITICAL CARE-no Performed by: Daleen Bo  Nursing Notes Reviewed/ Care Coordinated Applicable Imaging Reviewed Interpretation of Laboratory Data incorporated into ED treatment  The patient appears reasonably screened and/or stabilized for discharge and I doubt any other medical condition or other [facility] requiring further screening, evaluation, or treatment in the ED at this time prior to discharge.  Plan: Home Medications-continue routine medicines and OTC analgesia; Home Treatments-aggressive warm compresses, until improved; return here if the recommended treatment, does not improve the symptoms; Recommended follow up-PCP or general surgery follow-up as needed   Final Clinical Impressions(s) / ED Diagnoses   Final  diagnoses:  Abscess    ED Discharge Orders         Ordered    HYDROcodone-acetaminophen (NORCO) 5-325 MG tablet  Every 4 hours PRN     08/21/18 1035           Daleen Bo, MD 08/21/18 1036

## 2018-09-18 ENCOUNTER — Ambulatory Visit (INDEPENDENT_AMBULATORY_CARE_PROVIDER_SITE_OTHER): Payer: BLUE CROSS/BLUE SHIELD | Admitting: Internal Medicine

## 2018-09-18 ENCOUNTER — Other Ambulatory Visit: Payer: Self-pay

## 2018-09-18 ENCOUNTER — Encounter: Payer: Self-pay | Admitting: Internal Medicine

## 2018-09-18 VITALS — BP 125/76 | HR 62 | Temp 98.6°F | Ht 67.0 in | Wt 163.8 lb

## 2018-09-18 DIAGNOSIS — K219 Gastro-esophageal reflux disease without esophagitis: Secondary | ICD-10-CM | POA: Diagnosis not present

## 2018-09-18 DIAGNOSIS — N920 Excessive and frequent menstruation with regular cycle: Secondary | ICD-10-CM

## 2018-09-18 DIAGNOSIS — R5383 Other fatigue: Secondary | ICD-10-CM

## 2018-09-18 DIAGNOSIS — D509 Iron deficiency anemia, unspecified: Secondary | ICD-10-CM | POA: Diagnosis not present

## 2018-09-18 DIAGNOSIS — K2100 Gastro-esophageal reflux disease with esophagitis, without bleeding: Secondary | ICD-10-CM

## 2018-09-18 DIAGNOSIS — Z79899 Other long term (current) drug therapy: Secondary | ICD-10-CM

## 2018-09-18 MED ORDER — POLYSACCHARIDE IRON COMPLEX 150 MG PO CAPS: 150.0000 mg | ORAL_CAPSULE | Freq: Every day | ORAL | 3 refills | Status: DC

## 2018-09-18 MED ORDER — PANTOPRAZOLE SODIUM 20 MG PO TBEC: 20.0000 mg | DELAYED_RELEASE_TABLET | Freq: Every day | ORAL | 2 refills | Status: DC

## 2018-09-18 NOTE — Assessment & Plan Note (Signed)
She continue to experience fatigue. Does not get a refreshing sleep and woke up tired. She does snore but not aware of any apneic spells. She does wake up multiple times during the night with choking sensations and coughing.  That might be due to GERD symptoms as she is also having bad taste in mouth and responded well to Protonix in the past. Her PHQ 9 score was 3 today. TSH checked in February 2019 was normal. Her stop-bang score for sleep apnea is 2 which makes her low risk.  We did discuss regarding sleep study which she does not want to done at this time.  We discussed that her feeling of fatigue and not getting a well rested sleep might be due to GERD, she agreed to give a trial of Protonix. A prescription was provided. We will also check her CBC and ferritin level today-she will restart her iron supplement if needed.

## 2018-09-18 NOTE — Progress Notes (Signed)
   CC: For follow-up of her fatigue and iron deficiency anemia.  HPI:  [name] is a 42 y.o. with past medical history as listed below came to the clinic for follow-up of her iron deficiency anemia today.  Patient continued to experience fatigue.  She was also complaining of waking up multiple times during the night with choking sensations and coughing.  She does experience bad taste in her mouth.  She saw an ENT due to these choking sensations and they told her that most likely these are due to GERD symptoms and she was prescribed Protonix.  Patient did experience some relief of her symptoms with Protonix but does not take it on a regular basis.  Whenever she woke up in the morning he does not feel refreshed.  She does snore but not aware of any apneic spells.  She denies any pica.  Used to have a craving for ice in the beginning of the year when she was diagnosed with iron deficiency.  Her craving went away after getting iron transfusion.  She continued to experience heavy menstrual flow with clotting each month.  Her periods lasts more than 10 days.  Please see assessment and plan for her chronic conditions.  Past Medical History:  Diagnosis Date  . Anxiety   . Asthma    With Exacerbations  . Constipation    SP normal colonoscopy 7/08, GI lebaur Dr. Elicia Lamp.  Marland Kitchen GERD (gastroesophageal reflux disease)   . Hepatitis B immune   . Palpitations    Chronic   Review of Systems: Negative except mentioned in HPI.  Physical Exam:  Vitals:   09/18/18 1032  BP: 125/76  Pulse: 62  Temp: 98.6 F (37 C)  TempSrc: Oral  SpO2: 99%  Weight: 163 lb 12.8 oz (74.3 kg)  Height: 5\' 7"  (1.702 m)    General: Vital signs reviewed.  Patient is well-developed and well-nourished, in no acute distress and cooperative with exam.  Head: Normocephalic and atraumatic. Eyes: EOMI, conjunctivae normal, no scleral icterus.  Neck: Supple, trachea midline, normal ROM, no JVD, masses, thyromegaly,  or carotid bruit present.  Cardiovascular: RRR, S1 normal, S2 normal, no murmurs, gallops, or rubs. Pulmonary/Chest: Clear to auscultation bilaterally, no wheezes, rales, or rhonchi. Abdominal: Soft, non-tender, non-distended, BS +, no masses, organomegaly, or guarding present.  Extremities: No lower extremity edema bilaterally,  pulses symmetric and intact bilaterally.  Neurological: A&O x3, Strength is normal and symmetric bilaterally, cranial nerve II-XII are grossly intact, no focal motor deficit, sensory intact to light touch bilaterally.  Skin: Warm, dry and intact. No rashes or erythema. Psychiatric: Normal mood and affect. speech and behavior is normal. Cognition and memory are normal.  Assessment & Plan:   See Encounters Tab for problem based charting.  Patient discussed with Dr. Angelia Mould

## 2018-09-18 NOTE — Progress Notes (Deleted)
Iron deficiency anemia Patient is here for follow up of iron deficiency anemia.  She has a history of iron deficiency anemia secondary to menorrhagia earlier this year she received IV Feraheme transfusions. At follow-up in June reported symptoms consistent with anemia including fatigue and craving for ice.  At that time ferritin and hemoglobin were found to be within the normal limits. -Obtain ferritin and CBC -Continue over-the-counter ferrous sulfate supplementation

## 2018-09-18 NOTE — Progress Notes (Addendum)
Internal Medicine Clinic Attending  Case discussed with Dr. Reesa Chew at the time of the visit.  We reviewed the resident's history and exam and pertinent patient test results.  I agree with the assessment, diagnosis, and plan of care documented in the resident's note.   Reviewed Labs: Hgb down to 11.7, ferritin low at 10. Estimated iron deficit at 642mg .  Will arrange for repeat iron infusion.

## 2018-09-18 NOTE — Patient Instructions (Signed)
Thank you for visiting clinic today. We will check your hemoglobin and ferritin level today, will call you with any abnormal results. I am giving you a prescription for your iron supplement, you can start it if needed. I am also putting a referral to see a gynecologist for your heavy cycle. Contraceptive pills can help decrease the flow, we are not starting them as you do not want to go on a pill.  Your gynecologist should be able to discuss with you with other options. As we discussed your fatigue might be related to your bad sleep. You can try taking Protonix every day and see if that will help with those choking sensations and coughing at night. I did not order any sleep study at this time as you requested. Please follow-up in 35-month with your PCP.

## 2018-09-18 NOTE — Assessment & Plan Note (Addendum)
Patient has an history of iron deficiency anemia secondary to menorrhagia. She continued to experience heavy cycles with clotting.  Her cycle last for more than 10 days.  She has not seen a gynecologist. She denies any unusual craving at this time.  She used to crave for ice in the past when she had a ferritin of 7. She does not take any iron supplement since her Feraheme infusions in March 2019.  She has an history of p.o. iron intolerance, but agrees to give another trial of her previous nu iron supplement if needed as they were causing less GI symptoms.  Discussed with patient to start her on a oral contraceptive which she declined stating that she does not want to go on a pill. -Referral to see a gynecologist to discuss other options was provided. -We will recheck CBC and ferritin.  If found to be low she can restart her iron supplement, prescription was provided in case she needed. -If needed and patient was unable to tolerate p.o. iron then we can arrange IV iron at short stay.

## 2018-09-18 NOTE — Assessment & Plan Note (Signed)
Her symptoms of waking up multiple times with choking sensations and coughing might be due to GERD. She did found some relief when she was taking Protonix.  She was given a new prescription of Protonix and advised to take it regularly and see if that will help with those feelings and she can sleep peacefully.

## 2018-09-19 LAB — CBC
Hematocrit: 34.3 % (ref 34.0–46.6)
Hemoglobin: 11.7 g/dL (ref 11.1–15.9)
MCH: 31 pg (ref 26.6–33.0)
MCHC: 34.1 g/dL (ref 31.5–35.7)
MCV: 91 fL (ref 79–97)
Platelets: 307 10*3/uL (ref 150–450)
RBC: 3.78 x10E6/uL (ref 3.77–5.28)
RDW: 12.7 % (ref 12.3–15.4)
WBC: 4.6 10*3/uL (ref 3.4–10.8)

## 2018-09-19 LAB — FERRITIN: Ferritin: 10 ng/mL — ABNORMAL LOW (ref 15–150)

## 2018-09-21 NOTE — Addendum Note (Signed)
Addended by: Joni Reining C on: 09/21/2018 09:02 AM   Modules accepted: Orders, SmartSet

## 2018-09-24 ENCOUNTER — Other Ambulatory Visit: Payer: Self-pay | Admitting: Internal Medicine

## 2018-09-25 ENCOUNTER — Encounter: Payer: Self-pay | Admitting: *Deleted

## 2018-09-29 ENCOUNTER — Telehealth: Payer: Self-pay | Admitting: *Deleted

## 2018-09-29 ENCOUNTER — Other Ambulatory Visit: Payer: Self-pay | Admitting: Internal Medicine

## 2018-09-29 MED ORDER — SODIUM CHLORIDE 0.9 % IV SOLN: 510.0000 mg | Freq: Once | INTRAVENOUS | 0 refills | Status: AC

## 2018-09-29 NOTE — Telephone Encounter (Signed)
I did call and place an order, short stay asked me to just place an order for [facility] not any other preadmission orders or whole order set as she already had an appointment for tomorrow. Let me know if we need to do it any other way.

## 2018-09-29 NOTE — Telephone Encounter (Signed)
Call from [facility] - pt is scheduled for iron infusion tomorrow and they need orders. Text message sent to Dr Reesa Chew.

## 2018-09-30 ENCOUNTER — Ambulatory Visit (HOSPITAL_COMMUNITY)
Admission: RE | Admit: 2018-09-30 | Discharge: 2018-09-30 | Disposition: A | Discharge status: Home / Self Care | Payer: BLUE CROSS/BLUE SHIELD | Source: Ambulatory Visit | Attending: Internal Medicine | Admitting: Internal Medicine

## 2018-09-30 DIAGNOSIS — D509 Iron deficiency anemia, unspecified: Secondary | ICD-10-CM | POA: Insufficient documentation

## 2018-09-30 MED ORDER — SODIUM CHLORIDE 0.9 % IV SOLN
510.0000 mg | Freq: Once | INTRAVENOUS | Status: AC
  Administered 2018-09-30: 510 mg via INTRAVENOUS
  Filled 2018-09-30: qty 510

## 2018-10-01 ENCOUNTER — Encounter: Payer: Self-pay | Admitting: Family Medicine

## 2018-10-20 ENCOUNTER — Encounter: Payer: Self-pay | Admitting: Family Medicine

## 2018-10-20 ENCOUNTER — Encounter: Payer: BLUE CROSS/BLUE SHIELD | Admitting: Family Medicine

## 2018-10-20 ENCOUNTER — Ambulatory Visit (INDEPENDENT_AMBULATORY_CARE_PROVIDER_SITE_OTHER): Payer: BLUE CROSS/BLUE SHIELD | Admitting: Family Medicine

## 2018-10-20 VITALS — BP 126/70 | HR 62 | Wt 165.5 lb

## 2018-10-20 DIAGNOSIS — N939 Abnormal uterine and vaginal bleeding, unspecified: Secondary | ICD-10-CM

## 2018-10-20 DIAGNOSIS — D509 Iron deficiency anemia, unspecified: Secondary | ICD-10-CM | POA: Diagnosis not present

## 2018-10-20 NOTE — Progress Notes (Signed)
   Subjective:    Megan Richardson - 43 y.o. female MRN [id number]  Date of birth: 1976/04/13  HPI  Megan Richardson is a 43 y.o. No obstetric history on file. female here for iron deficiency anemia. Has heavy periods. No previous diagnosis of fibroids, endometriosis or other gynecological conditions. No prior imaging or endometrial biopsy. Still feels some symptoms of anemia such as shortness of breath and fatigue, despite normal hemoglobin levels last check. Although anemia is stable, still wanted to come in to discuss controlling the source of anemia.    OB History   No obstetric history on file.       Health Maintenance:   Health Maintenance Due  Topic Date Due  . PAP SMEAR-Modifier  12/23/2017  . INFLUENZA VACCINE  04/23/2018    -  reports that she has quit smoking. Her smoking use included cigarettes. She smoked 0.10 packs per day. She has never used smokeless tobacco. - Review of Systems: Per HPI. - Past Medical History: Patient Active Problem List   Diagnosis Date Noted  . Iron deficiency anemia 08/31/2016  . Fatigue 08/29/2016  . HIV exposure 03/06/2015  . Health care maintenance 11/16/2014  . GERD (gastroesophageal reflux disease) 04/14/2014  . Hyperlipidemia 08/30/2011  . Breast mass, right 02/11/2011  . ANXIETY STATE, UNSPECIFIED 07/25/2008  . Asthma 01/18/2008   - Medications: reviewed and updated   Objective:   Physical Exam BP 126/70   Pulse 62   Wt 165 lb 8 oz (75.1 kg)   BMI 25.92 kg/m  Gen: NAD, alert, cooperative with exam, well-appearing HEENT: NCAT, PERRL, clear conjunctiva, oropharynx clear, supple neck CV: RRR, good S1/S2, no murmur, no edema, capillary refill brisk  Resp: CTABL, no wheezes, non-labored Abd: SNTND, BS present, no guarding or organomegaly Skin: no rashes, normal turgor  Neuro: no gross deficits.  Psych: good insight, alert and oriented GU/GYN: deferred    Assessment & Plan:  1. Abnormal uterine bleeding - after some  discussion, patient would like EMB and mirena placement at the same visit - scheduled for follow up in 4 weeks  2. Iron deficiency anemia, unspecified iron deficiency anemia type - currently stable on iron supplements - will attempt source control as above   Routine preventative health maintenance measures emphasized. Please refer to After Visit Summary for other counseling recommendations.   No follow-ups on file.  Aura Camps, MD  OB Fellow  10/20/2018, 4:56 PM

## 2018-10-28 ENCOUNTER — Telehealth: Payer: Self-pay | Admitting: *Deleted

## 2018-10-28 NOTE — Telephone Encounter (Signed)
Megan Richardson called and left a voice message that she was seen here on 21st and was told she would get a phone call about [place] appointment to be done before her next appointment  which is 11/13/18.

## 2018-11-02 ENCOUNTER — Other Ambulatory Visit: Payer: Self-pay

## 2018-11-02 DIAGNOSIS — N939 Abnormal uterine and vaginal bleeding, unspecified: Secondary | ICD-10-CM

## 2018-11-02 NOTE — Progress Notes (Unsigned)
tra

## 2018-11-03 NOTE — Telephone Encounter (Signed)
Called patient about appointment scheduled and [place] scheduled, patient stated she had already been informed. Asked patient if she needed anything else patient declined and ended the call.

## 2018-11-10 ENCOUNTER — Ambulatory Visit (HOSPITAL_COMMUNITY)
Admission: RE | Admit: 2018-11-10 | Discharge: 2018-11-10 | Disposition: A | Discharge status: Home / Self Care | Payer: BLUE CROSS/BLUE SHIELD | Source: Ambulatory Visit | Attending: Family Medicine | Admitting: Family Medicine

## 2018-11-10 ENCOUNTER — Ambulatory Visit (HOSPITAL_COMMUNITY): Payer: BLUE CROSS/BLUE SHIELD

## 2018-11-10 DIAGNOSIS — N939 Abnormal uterine and vaginal bleeding, unspecified: Secondary | ICD-10-CM | POA: Diagnosis not present

## 2018-11-10 DIAGNOSIS — D259 Leiomyoma of uterus, unspecified: Secondary | ICD-10-CM | POA: Diagnosis not present

## 2018-11-13 ENCOUNTER — Ambulatory Visit (INDEPENDENT_AMBULATORY_CARE_PROVIDER_SITE_OTHER): Payer: BLUE CROSS/BLUE SHIELD | Admitting: Family Medicine

## 2018-11-13 ENCOUNTER — Encounter: Payer: Self-pay | Admitting: Family Medicine

## 2018-11-13 ENCOUNTER — Other Ambulatory Visit (HOSPITAL_COMMUNITY)
Admission: RE | Admit: 2018-11-13 | Discharge: 2018-11-13 | Disposition: A | Discharge status: Home / Self Care | Payer: BLUE CROSS/BLUE SHIELD | Source: Ambulatory Visit | Attending: Family Medicine | Admitting: Family Medicine

## 2018-11-13 VITALS — BP 127/89 | HR 71 | Ht 67.0 in | Wt 165.1 lb

## 2018-11-13 DIAGNOSIS — R109 Unspecified abdominal pain: Secondary | ICD-10-CM

## 2018-11-13 DIAGNOSIS — N939 Abnormal uterine and vaginal bleeding, unspecified: Secondary | ICD-10-CM | POA: Insufficient documentation

## 2018-11-13 DIAGNOSIS — Z3202 Encounter for pregnancy test, result negative: Secondary | ICD-10-CM | POA: Diagnosis not present

## 2018-11-13 LAB — POCT PREGNANCY, URINE: Preg Test, Ur: NEGATIVE

## 2018-11-13 MED ORDER — IBUPROFEN 200 MG PO TABS
600.0000 mg | ORAL_TABLET | Freq: Once | ORAL | Status: AC
  Administered 2018-11-13: 600 mg via ORAL

## 2018-11-13 MED ORDER — LEVONORGESTREL 20 MCG/24HR IU IUD
INTRAUTERINE_SYSTEM | Freq: Once | INTRAUTERINE | Status: AC
  Administered 2018-11-13: 11:00:00 via INTRAUTERINE

## 2018-11-13 NOTE — Progress Notes (Signed)
Subjective:    Megan Richardson - 43 y.o. female MRN [id number]  Date of birth: November 18, 1975  HPI  Megan Richardson is a 43 y.o. [phone] female here for AUB. Had transvaginal US, which showed 2 fibroids.  After discussion of risks, benefits and other treatment options, patient would like to proceed with EMB and placement of Mirena.   OB History    Gravida  2   Para  2   Term  2   Preterm      AB      Living  2     SAB      TAB      Ectopic      Multiple      Live Births  2             Health Maintenance:  Health Maintenance Due  Topic Date Due  . PAP SMEAR-Modifier  12/23/2017  . INFLUENZA VACCINE  04/23/2018    -  reports that she has quit smoking. Her smoking use included cigarettes. She smoked 0.10 packs per day. She has never used smokeless tobacco. - Review of Systems: Per HPI. - Past Medical History: Patient Active Problem List   Diagnosis Date Noted  . Iron deficiency anemia 08/31/2016  . Fatigue 08/29/2016  . HIV exposure 03/06/2015  . Health care maintenance 11/16/2014  . GERD (gastroesophageal reflux disease) 04/14/2014  . Hyperlipidemia 08/30/2011  . Breast mass, right 02/11/2011  . ANXIETY STATE, UNSPECIFIED 07/25/2008  . Asthma 01/18/2008   - Medications: reviewed and updated   Objective:   Physical Exam BP 127/89   Pulse 71   Ht 5\' 7"  (1.702 m)   Wt 165 lb 1.6 oz (74.9 kg)   LMP 10/31/2018 (Exact Date)   BMI 25.86 kg/m  Gen: NAD, alert, cooperative with exam, well-appearing HEENT: NCAT, PERRL, clear conjunctiva Resp: normal effort, non-labored Skin: no rashes, normal turgor  Neuro: no gross deficits.  Psych: good insight, alert and oriented GU/GYN: Exam performed in the presence of a chaperone. External genitalia within normal limits.  Vaginal mucosa pink, moist, normal rugae.  Nonfriable cervix without lesions, no discharge or bleeding noted on speculum exam.    ENDOMETRIAL BIOPSY and IUD Insertion Procedure Note The  indications for endometrial biopsy were reviewed.   Risks of the biopsy including cramping, bleeding, infection, uterine perforation, inadequate specimen and need for additional procedures  were discussed. The patient states she understands and agrees to undergo procedure today. Consent was signed. Time out was performed. Urine HCG was negative.  Discussed risks of IUD including irregular bleeding, cramping, infection, malpositioning or misplacement of the IUD outside the uterus which may require further procedure such as laparoscopy. Time out was performed.  Urine pregnancy test negative.  During the pelvic exam, the cervix was prepped with Betadine. A single-toothed tenaculum was placed on the anterior lip of the cervix to stabilize it. The 3 mm pipelle was introduced into the endometrial cavity without difficulty to a depth of 9cm, and a moderate amount of tissue was obtained and sent to pathology. Mirena IUD placed per manufacturer's recommendations.  Strings trimmed to 4 cm. Tenaculum was removed, good hemostasis noted.  Patient tolerated procedure well.  The instruments were removed from the patient's vagina. Minimal bleeding from the cervix was noted. The patient tolerated the procedure well. Routine post-procedure instructions were given to the patient.  Patient was also asked to check IUD strings periodically and follow up in 4 weeks for IUD check.  Assessment & Plan:   1. Abnormal uterine bleeding - TVUS with endometrial stripe measuring 10.81mm and 2 fibroids, largest 3.5cm - EMB today  - levonorgestrel (MIRENA) 20 MCG/24HR IUD inserted  2. Abdominal cramping - ibuprofen (ADVIL,MOTRIN) tablet 600 mg   Routine preventative health maintenance measures emphasized. Please refer to After Visit Summary for other counseling recommendations.   Return in about 4 weeks (around 12/11/2018), or IUD check.  Aura Camps, MD  OB Fellow  11/13/2018, 12:34 PM

## 2018-11-18 ENCOUNTER — Telehealth: Payer: Self-pay

## 2018-11-24 ENCOUNTER — Telehealth: Payer: Self-pay

## 2018-11-24 NOTE — Telephone Encounter (Signed)
Pt calls for GI referral for difficulty swallowing and sore throat, ENT stated she needed to see GI. Told her to have pcp order referral

## 2018-11-24 NOTE — Telephone Encounter (Signed)
Requesting to speak with about med, please call pt back.

## 2018-12-01 ENCOUNTER — Other Ambulatory Visit: Payer: Self-pay | Admitting: Internal Medicine

## 2018-12-01 DIAGNOSIS — R1310 Dysphagia, unspecified: Secondary | ICD-10-CM

## 2018-12-01 NOTE — Telephone Encounter (Signed)
Can you please send records from ENT

## 2018-12-01 NOTE — Telephone Encounter (Signed)
ent visit is in [place] 12/01/2017, she has not seen them since. Dr Blenda Nicely is the md

## 2018-12-02 ENCOUNTER — Other Ambulatory Visit: Payer: Self-pay | Admitting: Internal Medicine

## 2018-12-02 DIAGNOSIS — R1310 Dysphagia, unspecified: Secondary | ICD-10-CM

## 2018-12-02 NOTE — Telephone Encounter (Signed)
Ok referral sent.

## 2018-12-04 NOTE — Telephone Encounter (Signed)
Opened in Error.

## 2018-12-10 ENCOUNTER — Telehealth: Payer: Self-pay | Admitting: Family Medicine

## 2018-12-10 NOTE — Telephone Encounter (Signed)
Called patient with information about after hours.

## 2018-12-14 ENCOUNTER — Ambulatory Visit: Payer: BLUE CROSS/BLUE SHIELD | Admitting: Obstetrics and Gynecology

## 2018-12-16 ENCOUNTER — Encounter: Payer: Self-pay | Admitting: *Deleted

## 2019-03-05 ENCOUNTER — Telehealth: Payer: BLUE CROSS/BLUE SHIELD | Admitting: Family

## 2019-03-05 DIAGNOSIS — B029 Zoster without complications: Secondary | ICD-10-CM | POA: Diagnosis not present

## 2019-03-05 MED ORDER — VALACYCLOVIR HCL 1 G PO TABS: 1000.0000 mg | ORAL_TABLET | Freq: Three times a day (TID) | ORAL | 0 refills | Status: DC

## 2019-03-05 MED ORDER — GABAPENTIN 300 MG PO CAPS: 300.0000 mg | ORAL_CAPSULE | Freq: Two times a day (BID) | ORAL | 0 refills | Status: DC | PRN

## 2019-03-05 NOTE — Progress Notes (Signed)
Greater than 5 minutes, yet less than 10 minutes of time have been spent researching, coordinating, and implementing care for this patient today.  Thank you for the details you included in the comment boxes. Those details are very helpful in determining the best course of treatment for you and help [place] to provide the best care.   E-visit for Shingles   We are sorry that you are not feeling well. Here is how we plan to help!  Based on what you shared with me it looks like you have shingles.  Shingles or herpes zoster, is a common infection of the nerves.  It is a painful rash caused by the herpes zoster virus.  This is the same virus that causes chickenpox.  After a person has chickenpox, the virus remains inactive in the nerve cells.  Years later, the virus can become active again and travel to the skin.  It typically will appear on one side of the face or body.  Burning or shooting pain, tingling, or itching are early signs of the infection.  Blisters typically scab over in 7 to 10 days and clear up within 2-4 weeks. Shingles is only contagious to people that have never had the chickenpox, the chickenpox vaccine, or anyone who has a compromised immune system.  You should avoid contact with these type of people until your blisters scab over.  I have prescribed Valacyclovir 1g three times daily for 7 days and also Gabapentin 300mg  twice daily as needed for pain   HOME CARE: . Apply ice packs (wrapped in a thin towel), cool compresses, or soak in cool bath to help reduce pain. . Use calamine lotion to calm itchy skin. . Avoid scratching the rash. . Avoid direct sunlight.  GET HELP RIGHT AWAY IF: . Symptoms that don't away after treatment. . A rash or blisters near your eye. . Increased drainage, fever, or rash after treatment. . Severe pain that doesn't go away.   MAKE SURE YOU    Understand these instructions.  Will watch your condition.  Will get help right away if you are not doing  well or get worse.  Thank you for choosing an e-visit. Your e-visit answers were reviewed by a board certified advanced clinical practitioner to complete your personal care plan. Depending upon the condition, your plan could have included both over the counter or prescription medications.  Please review your pharmacy choice. Make sure the pharmacy is open so you can pick up prescription now. If there is a problem, you may contact your provider through [employer] and have the prescription routed to another pharmacy.  Your safety is important to [place]. If you have drug allergies check your prescription carefully.   For the next 24 hours you can use MyChart to ask questions about today's visit, request a non-urgent call back, or ask for a work or school excuse.  You will get an email in the next two days asking about your experience. I hope that your e-visit has been valuable and will speed your recovery

## 2019-03-22 ENCOUNTER — Encounter: Payer: Self-pay | Admitting: *Deleted

## 2019-04-24 DIAGNOSIS — Z20828 Contact with and (suspected) exposure to other viral communicable diseases: Secondary | ICD-10-CM | POA: Diagnosis not present

## 2019-06-07 ENCOUNTER — Ambulatory Visit (INDEPENDENT_AMBULATORY_CARE_PROVIDER_SITE_OTHER): Payer: BC Managed Care – PPO | Admitting: Family Medicine

## 2019-06-07 ENCOUNTER — Other Ambulatory Visit: Payer: Self-pay

## 2019-06-07 ENCOUNTER — Encounter: Payer: Self-pay | Admitting: Family Medicine

## 2019-06-07 VITALS — BP 129/83 | HR 60 | Wt 168.2 lb

## 2019-06-07 DIAGNOSIS — N939 Abnormal uterine and vaginal bleeding, unspecified: Secondary | ICD-10-CM | POA: Diagnosis not present

## 2019-06-07 DIAGNOSIS — D509 Iron deficiency anemia, unspecified: Secondary | ICD-10-CM

## 2019-06-07 DIAGNOSIS — T8332XA Displacement of intrauterine contraceptive device, initial encounter: Secondary | ICD-10-CM

## 2019-06-07 HISTORY — DX: Abnormal uterine and vaginal bleeding, unspecified: N93.9

## 2019-06-07 HISTORY — DX: Displacement of intrauterine contraceptive device, initial encounter: T83.32XA

## 2019-06-07 MED ORDER — NORGESTIM-ETH ESTRAD TRIPHASIC 0.18/0.215/0.25 MG-35 MCG PO TABS: 1.0000 | ORAL_TABLET | Freq: Every day | ORAL | 4 refills | Status: DC

## 2019-06-07 NOTE — Progress Notes (Signed)
GYNECOLOGY OFFICE VISIT NOTE  History:   Megan Richardson is a 43 y.o. [phone] here today for AUB.  Last seen 10/2018 for AUB Had EMB at that time that was normal, TVUS showing 3.5cm posterior fibroid as well as another sub-cm fibroid, and had Mirena placed  Reports since that time periods are heavier than before Having large clots Reports never got any improvement since Mirena placement Wants to discuss having a hysterectomy Might be open to trying OCPs    Past Medical History:  Diagnosis Date  . Anxiety   . Asthma    With Exacerbations  . Constipation    SP normal colonoscopy 7/08, GI lebaur Dr. Elicia Richardson.  Marland Kitchen GERD (gastroesophageal reflux disease)   . Hepatitis B immune   . Palpitations    Chronic    Past Surgical History:  Procedure Laterality Date  . CESAREAN SECTION     Multiple  . Tubal ligation  2006    The following portions of the patient's history were reviewed and updated as appropriate: allergies, current medications, past family history, past medical history, past social history, past surgical history and problem list.   Health Maintenance:  Normal pap 12/2016.  Normal mammogram on 03/2018.   Review of Systems:  Pertinent items noted in HPI and remainder of comprehensive ROS otherwise negative.  Physical Exam:  BP 129/83   Pulse 60   Wt 168 lb 3.2 oz (76.3 kg)   BMI 26.34 kg/m  CONSTITUTIONAL: Well-developed, well-nourished female in no acute distress.  HEENT:  Normocephalic, atraumatic. External right and left ear normal. No scleral icterus.  NECK: Normal range of motion, supple, no masses noted on observation SKIN: No rash noted. Not diaphoretic. No erythema. No pallor. MUSCULOSKELETAL: Normal range of motion. No edema noted. NEUROLOGIC: Alert and oriented to person, place, and time. Normal muscle tone coordination. PSYCHIATRIC: Normal mood and affect. Normal behavior. Normal judgment and thought content. RESPIRATORY: Effort normal, no problems  with respiration noted PELVIC: No strings seen at cervical os, unable to twist any onto cyto brush  Labs and Imaging No results found for this or any previous visit (from the past 168 hour(s)). No results found.    Assessment and Plan:   Problem List Items Addressed This Visit      Genitourinary   Abnormal uterine bleeding - Primary    Initially had long discussion with patient regarding failure of Mirena for AUB and had come to decision to trial OCP's while awaiting consultation with GYN provider to discuss hysterectomy. Also requested Mirena be removed, however no strings visible on exam and patient reports never returned for string check. Unclear if IUD is still in place and therefore unclear if ever failed trial of hormonal IUD. Will obtain TVUS to assess if present and follow up with patient. If IUD in place will have her return for removal and proceed with plan for surgical consultation, otherwise will see if she is willing to try IUD again. In mean time would like to trial OCP's. Discussed option of running together 3 packs consecutively and having withdrawal bleed q3 months, she will trial with understanding she may have breakthrough bleeding with prolonged periods of no withdrawal bleed, rx sent to pharmacy.       Relevant Medications   Norgestimate-Ethinyl Estradiol Triphasic 0.18/0.215/0.25 MG-35 MCG tablet     Other   Iron deficiency anemia   Relevant Orders   CBC   Displacement of intrauterine contraceptive device   Relevant Orders  US Transvaginal Non-OB      Return for once results of [place] are known.    Megan Richardson, [place] for [name], [name]

## 2019-06-07 NOTE — Assessment & Plan Note (Addendum)
Initially had long discussion with patient regarding failure of Mirena for AUB and had come to decision to trial OCP's while awaiting consultation with GYN provider to discuss hysterectomy. Also requested Mirena be removed, however no strings visible on exam and patient reports never returned for string check. Unclear if IUD is still in place and therefore unclear if ever failed trial of hormonal IUD. Will obtain TVUS to assess if present and follow up with patient. If IUD in place will have her return for removal and proceed with plan for surgical consultation, otherwise will see if she is willing to try IUD again. In mean time would like to trial OCP's. Discussed option of running together 3 packs consecutively and having withdrawal bleed q3 months, she will trial with understanding she may have breakthrough bleeding with prolonged periods of no withdrawal bleed, rx sent to pharmacy.

## 2019-06-09 LAB — CBC
Hematocrit: 32.7 % — ABNORMAL LOW (ref 34.0–46.6)
Hemoglobin: 10.5 g/dL — ABNORMAL LOW (ref 11.1–15.9)
MCH: 29.5 pg (ref 26.6–33.0)
MCHC: 32.1 g/dL (ref 31.5–35.7)
MCV: 92 fL (ref 79–97)
Platelets: 365 10*3/uL (ref 150–450)
RBC: 3.56 x10E6/uL — ABNORMAL LOW (ref 3.77–5.28)
RDW: 13 % (ref 11.7–15.4)
WBC: 5.6 10*3/uL (ref 3.4–10.8)

## 2019-06-14 ENCOUNTER — Encounter: Payer: Self-pay | Admitting: Gastroenterology

## 2019-06-14 ENCOUNTER — Telehealth: Payer: Self-pay

## 2019-06-14 DIAGNOSIS — T8332XD Displacement of intrauterine contraceptive device, subsequent encounter: Secondary | ICD-10-CM

## 2019-06-14 NOTE — Telephone Encounter (Signed)
MyChart message sent to pt about her [place] appt scheduled for 06/24/19 @ 1000.

## 2019-06-21 ENCOUNTER — Ambulatory Visit (HOSPITAL_COMMUNITY)
Admission: RE | Admit: 2019-06-21 | Discharge: 2019-06-21 | Disposition: A | Discharge status: Home / Self Care | Payer: BC Managed Care – PPO | Source: Ambulatory Visit | Attending: Family Medicine | Admitting: Family Medicine

## 2019-06-21 ENCOUNTER — Other Ambulatory Visit: Payer: Self-pay

## 2019-06-21 DIAGNOSIS — D250 Submucous leiomyoma of uterus: Secondary | ICD-10-CM | POA: Diagnosis not present

## 2019-06-21 DIAGNOSIS — T8332XA Displacement of intrauterine contraceptive device, initial encounter: Secondary | ICD-10-CM

## 2019-06-24 ENCOUNTER — Ambulatory Visit (HOSPITAL_COMMUNITY): Payer: BC Managed Care – PPO

## 2019-06-24 NOTE — Telephone Encounter (Signed)
Patient's ultrasound shows no visible IUD, but is otherwise normal with normal ovaries and previously seen fibroids.   Her situation is tricky--please call her and explain that since her bleeding never improved there are two possibilities:  1. Her IUD fell out spontaneously with her bleeding and she was not aware of it. This is most likely what happened.   2. She had the rare complication of a uterine perforation during her IUD placement. I think this is less likely, but it is important that we rule out this possibility, so please ask her to schedule an appt for X-rays of her pelvis and abdomen at [facility], I have already ordered these.   Since she does not seem to have had an IUD in place all these months, I think once we rule out a perforation it would be worthwhile trialing the IUD again as a way to control her bleeding. If she is open to this possibility please schedule her in 4-6 weeks for IUD placement.

## 2019-06-25 ENCOUNTER — Emergency Department (HOSPITAL_COMMUNITY)
Admission: EM | Admit: 2019-06-25 | Discharge: 2019-06-25 | Disposition: A | Discharge status: Home / Self Care | Payer: BC Managed Care – PPO | Attending: Emergency Medicine | Admitting: Emergency Medicine

## 2019-06-25 ENCOUNTER — Other Ambulatory Visit: Payer: Self-pay

## 2019-06-25 ENCOUNTER — Telehealth: Payer: Self-pay | Admitting: *Deleted

## 2019-06-25 ENCOUNTER — Encounter (HOSPITAL_COMMUNITY): Payer: Self-pay

## 2019-06-25 ENCOUNTER — Emergency Department (HOSPITAL_COMMUNITY): Payer: BC Managed Care – PPO

## 2019-06-25 DIAGNOSIS — Z87891 Personal history of nicotine dependence: Secondary | ICD-10-CM | POA: Insufficient documentation

## 2019-06-25 DIAGNOSIS — J45909 Unspecified asthma, uncomplicated: Secondary | ICD-10-CM | POA: Diagnosis not present

## 2019-06-25 DIAGNOSIS — N939 Abnormal uterine and vaginal bleeding, unspecified: Secondary | ICD-10-CM | POA: Diagnosis not present

## 2019-06-25 DIAGNOSIS — Z79899 Other long term (current) drug therapy: Secondary | ICD-10-CM | POA: Insufficient documentation

## 2019-06-25 DIAGNOSIS — Z975 Presence of (intrauterine) contraceptive device: Secondary | ICD-10-CM | POA: Insufficient documentation

## 2019-06-25 LAB — URINALYSIS, ROUTINE W REFLEX MICROSCOPIC
Bilirubin Urine: NEGATIVE
Glucose, UA: NEGATIVE mg/dL
Ketones, ur: NEGATIVE mg/dL
Leukocytes,Ua: NEGATIVE
Nitrite: NEGATIVE
Protein, ur: NEGATIVE mg/dL
RBC / HPF: >50 RBC/hpf — ABNORMAL HIGH (ref 0–5)
Specific Gravity, Urine: 1.017 (ref 1.005–1.030)
pH: 6 (ref 5.0–8.0)

## 2019-06-25 LAB — COMPREHENSIVE METABOLIC PANEL
ALT: 12 U/L (ref 0–44)
AST: 14 U/L — ABNORMAL LOW (ref 15–41)
Albumin: 3.6 g/dL (ref 3.5–5.0)
Alkaline Phosphatase: 62 U/L (ref 38–126)
Anion gap: 7 (ref 5–15)
BUN: 11 mg/dL (ref 6–20)
CO2: 24 mmol/L (ref 22–32)
Calcium: 8.9 mg/dL (ref 8.9–10.3)
Chloride: 107 mmol/L (ref 98–111)
Creatinine, Ser: 0.8 mg/dL (ref 0.44–1.00)
GFR calc Af Amer: >60 mL/min (ref >60)
GFR calc non Af Amer: >60 mL/min (ref >60)
Glucose, Bld: 96 mg/dL (ref 70–99)
Potassium: 3.9 mmol/L (ref 3.5–5.1)
Sodium: 138 mmol/L (ref 135–145)
Total Bilirubin: 0.6 mg/dL (ref 0.3–1.2)
Total Protein: 7.1 g/dL (ref 6.5–8.1)

## 2019-06-25 LAB — CBC WITH DIFFERENTIAL/PLATELET
Abs Immature Granulocytes: 0.02 10*3/uL (ref 0.00–0.07)
Basophils Absolute: 0 10*3/uL (ref 0.0–0.1)
Basophils Relative: 0 %
Eosinophils Absolute: 0 10*3/uL (ref 0.0–0.5)
Eosinophils Relative: 0 %
HCT: 33.6 % — ABNORMAL LOW (ref 36.0–46.0)
Hemoglobin: 10.6 g/dL — ABNORMAL LOW (ref 12.0–15.0)
Immature Granulocytes: 0 %
Lymphocytes Relative: 26 %
Lymphs Abs: 1.6 10*3/uL (ref 0.7–4.0)
MCH: 29.2 pg (ref 26.0–34.0)
MCHC: 31.5 g/dL (ref 30.0–36.0)
MCV: 92.6 fL (ref 80.0–100.0)
Monocytes Absolute: 0.3 10*3/uL (ref 0.1–1.0)
Monocytes Relative: 4 %
Neutro Abs: 4.2 10*3/uL (ref 1.7–7.7)
Neutrophils Relative %: 70 %
Platelets: 391 10*3/uL (ref 150–400)
RBC: 3.63 MIL/uL — ABNORMAL LOW (ref 3.87–5.11)
RDW: 14.6 % (ref 11.5–15.5)
WBC: 6.1 10*3/uL (ref 4.0–10.5)
nRBC: 0 % (ref 0.0–0.2)

## 2019-06-25 LAB — I-STAT BETA HCG BLOOD, ED (MC, WL, AP ONLY): I-stat hCG, quantitative: <5 m[IU]/mL (ref <5)

## 2019-06-25 MED ORDER — MEGESTROL ACETATE 40 MG PO TABS: 40.0000 mg | ORAL_TABLET | Freq: Every day | ORAL | 0 refills | Status: AC

## 2019-06-25 MED ORDER — IBUPROFEN 200 MG PO TABS
600.0000 mg | ORAL_TABLET | Freq: Once | ORAL | Status: AC
  Administered 2019-06-25: 600 mg via ORAL
  Filled 2019-06-25: qty 3

## 2019-06-25 MED ORDER — FENTANYL CITRATE (PF) 100 MCG/2ML IJ SOLN
INTRAMUSCULAR | Status: AC
  Filled 2019-06-25: qty 2

## 2019-06-25 NOTE — Telephone Encounter (Addendum)
I called Megan Richardson back to discuss her bleeding. She reports bleeding started light for maybe 1/2 day ; then has been heavy enough that she has to change her pad 2-3 times per hour and is saturated. C/o feeling tired, nauseated, pale. I advised her I think she will need to go to the hospital ; but I will review with a provider and call her back .   I discussed with Dr.Ervin and he advised for her to go to Urgent care or ED. I informed Megan Richardson that the provider advised her to to Urgent Care or ED. She voices understanding.  Linda,RN

## 2019-06-25 NOTE — ED Triage Notes (Signed)
Patient states she had an IUD placed in 2/20 for heavy vaginal bleeding, but periods of being heavier. Patient states she had an US done on the 22nd and the IUD was not to be found. Patient states she was also started on oral birth control. Patient states she has had heavy vaginal bleeding with clots. Patient reports that she has had to have blood transfusion to the vaginal bleeding in the past.

## 2019-06-25 NOTE — Discharge Instructions (Addendum)
Stop taking your birth control. Start taking the Megace as directed (twice daily).   Please call your OB/GYN and set up an appointment for follow-up with a gynecologist.  Return to the emergency department for any new or worsening symptoms including lightheadedness, dizziness, passing out, bleeding through more than 1 pad per hour for greater than 3 consecutive hours.

## 2019-06-25 NOTE — ED Provider Notes (Signed)
[facility] DEPT Provider Note   CSN: [id number] Arrival date & time: 06/25/19  1038     History   Chief Complaint Chief Complaint  Patient presents with  . Vaginal Bleeding    HPI Megan Richardson is a 43 y.o. female.     HPI   Pt is a 43 y/o female with a h/o anxiety, asthma, GERD, who presents to the ED today for eval of vaginal bleeding. She states she had IUD placed in Feb because she was having heavy menses. Sxs did not improve. She was seen 9/14 to have IUD removed. MD did not see IUD strings on exam and completed bedside US but did not see IUD. On that day she was started on OCP's to help with the bleeding. She later had formal transvaginal US on 9/22 and IUD was not identified. She called office again and xray of abd/pelvis was ordered but she has not had these completed yet. She was advised to come to the ED for eval due to her ongoing bleeding.   She states she goes throught 2-3 pads per hour for the last week. Sxs seem improved when sitting but upon standing bleeding worsened. She is passing bright red blood and clots.  States she feels very tired and weak. She has also had abd cramping.   LMP 06/01/19. States menses are usually pretty regular.  She is currently sexually active but does not use protection.  She is not concerned for STDs and declined testing.  [facility]    Past Medical History:  Diagnosis Date  . Anxiety   . Asthma    With Exacerbations  . Constipation    SP normal colonoscopy 7/08, GI lebaur Dr. Elicia Lamp.  Marland Kitchen GERD (gastroesophageal reflux disease)   . Hepatitis B immune   . Palpitations    Chronic    Patient Active Problem List   Diagnosis Date Noted  . Abnormal uterine bleeding 06/07/2019  . Displacement of intrauterine contraceptive device 06/07/2019  . Iron deficiency anemia 08/31/2016  . Fatigue 08/29/2016  . HIV exposure 03/06/2015  . Health care maintenance 11/16/2014  . GERD  (gastroesophageal reflux disease) 04/14/2014  . Hyperlipidemia 08/30/2011  . Breast mass, right 02/11/2011  . ANXIETY STATE, UNSPECIFIED 07/25/2008  . Asthma 01/18/2008    Past Surgical History:  Procedure Laterality Date  . CESAREAN SECTION     Multiple  . Tubal ligation  2006     OB History    Gravida  2   Para  2   Term  2   Preterm      AB      Living  2     SAB      TAB      Ectopic      Multiple      Live Births  2            Home Medications    Prior to Admission medications   Medication Sig Start Date End Date Taking? Authorizing Provider  albuterol (VENTOLIN HFA) 108 (90 Base) MCG/ACT inhaler Inhale 2 puffs into the lungs every 6 (six) hours as needed for wheezing or shortness of breath. 08/29/16  Yes Holley Raring, MD  ibuprofen (ADVIL) 200 MG tablet Take 400 mg by mouth every 6 (six) hours as needed (Pain).   Yes [provider]  Norgestimate-Ethinyl Estradiol Triphasic 0.18/0.215/0.25 MG-35 MCG tablet Take 1 tablet by mouth daily. 06/07/19  Yes Clarnce Flock, MD  iron polysaccharides (NU-IRON) 150 MG capsule Take 1 capsule (150 mg total) by mouth daily. Patient not taking: Reported on 10/20/2018 09/18/18   Lorella Nimrod, MD  megestrol (MEGACE) 40 MG tablet Take 1 tablet (40 mg total) by mouth daily. 06/25/19 07/25/19  Avianah Pellman S, PA-C  pantoprazole (PROTONIX) 20 MG tablet Take 1 tablet (20 mg total) by mouth daily. Patient not taking: Reported on 06/07/2019 09/18/18   Lorella Nimrod, MD    Family History Family History  Problem Relation Age of Onset  . Hypertension Mother   . BRCA 1/2 Maternal Grandmother 80  . Cancer Maternal Aunt        thoat   . Cancer Maternal Grandfather        lung    Social History Social History   Tobacco Use  . Smoking status: Former Smoker    Packs/day: 0.10    Types: Cigarettes  . Smokeless tobacco: Never Used  Substance Use Topics  . Alcohol use: Yes    Alcohol/week: 0.0 standard  drinks    Comment: Only socially.   . Drug use: No     Allergies   Patient has no known allergies.   Review of Systems Review of Systems  Constitutional: Positive for fatigue. Negative for fever.  HENT: Negative for ear pain and sore throat.   Eyes: Negative for visual disturbance.  Respiratory: Negative for cough and shortness of breath.   Cardiovascular: Negative for chest pain.  Gastrointestinal: Negative for abdominal pain, constipation, diarrhea, nausea and vomiting.  Genitourinary: Positive for pelvic pain and vaginal bleeding. Negative for decreased urine volume, dysuria, flank pain, frequency, hematuria and vaginal discharge.  Musculoskeletal: Negative for back pain.  Skin: Negative for rash.  Neurological: Negative for headaches.  All other systems reviewed and are negative.    Physical Exam Updated Vital Signs BP 130/87   Pulse (!) 55   Temp 98.6 F (37 C) (Oral)   Resp 16   Ht '5\' 7"'  (1.702 m)   Wt 74.8 kg   LMP 06/01/2019 Comment: IUD  SpO2 100%   BMI 25.84 kg/m   Physical Exam Vitals signs and nursing note reviewed.  Constitutional:      General: She is not in acute distress.    Appearance: She is well-developed.  HENT:     Head: Normocephalic and atraumatic.  Eyes:     Conjunctiva/sclera: Conjunctivae normal.  Neck:     Musculoskeletal: Neck supple.  Cardiovascular:     Rate and Rhythm: Normal rate and regular rhythm.     Pulses: Normal pulses.     Heart sounds: Normal heart sounds. No murmur.  Pulmonary:     Effort: Pulmonary effort is normal. No respiratory distress.     Breath sounds: Normal breath sounds. No wheezing, rhonchi or rales.  Abdominal:     General: Bowel sounds are normal.     Palpations: Abdomen is soft.     Tenderness: There is no abdominal tenderness. There is no guarding or rebound.  Genitourinary:    Comments: Exam performed by Rodney Booze,  exam chaperoned Date: 06/25/2019 Pelvic exam: normal external genitalia  without evidence of trauma. VULVA: normal appearing vulva with no masses, tenderness or lesion. VAGINA: normal appearing vagina with normal color and discharge, no lesions, moderate blood in the vaginal vault CERVIX: normal appearing cervix without lesions, cervical motion tenderness absent, cervical os closed with out purulent discharge; vaginal discharge not present, Wet prep and DNA probe for chlamydia and GC obtained.  ADNEXA: normal adnexa in size, nontender and no masses UTERUS: uterus is normal size, shape, consistency and is mildly tender Skin:    General: Skin is warm and dry.  Neurological:     Mental Status: She is alert.      ED Treatments / Results  Labs (all labs ordered are listed, but only abnormal results are displayed) Labs Reviewed  CBC WITH DIFFERENTIAL/PLATELET - Abnormal; Notable for the following components:      Result Value   RBC 3.63 (*)    Hemoglobin 10.6 (*)    HCT 33.6 (*)    All other components within normal limits  COMPREHENSIVE METABOLIC PANEL - Abnormal; Notable for the following components:   AST 14 (*)    All other components within normal limits  URINALYSIS, ROUTINE W REFLEX MICROSCOPIC - Abnormal; Notable for the following components:   Hgb urine dipstick MODERATE (*)    RBC / HPF >50 (*)    Bacteria, UA RARE (*)    All other components within normal limits  I-STAT BETA HCG BLOOD, ED ([facility], [facility], [facility] ONLY)    EKG None  Radiology Dg Pelvis 1-2 Views  Result Date: 06/25/2019 CLINICAL DATA:  Evaluate for IUD EXAM: PELVIS - 1-2 VIEW COMPARISON:  None. FINDINGS: There is no evidence of pelvic fracture or diastasis. No pelvic bone lesions are seen. Small calcified pelvic phlebolith. No IUD or other radiopaque foreign bodies identified within the pelvis. IMPRESSION: No IUD visualized. Electronically Signed   By: Davina Poke M.D.   On: 06/25/2019 13:01   Dg Abdomen 1 View  Result Date: 06/25/2019 CLINICAL DATA:  Lost IUD. EXAM: ABDOMEN - 1  VIEW COMPARISON:  None. FINDINGS: The bowel gas pattern is normal. No IUD visualized within the abdomen or pelvis. IMPRESSION: Normal bowel gas pattern. No IUD visualized. Electronically Signed   By: Marlaine Hind M.D.   On: 06/25/2019 13:02    Procedures Procedures (including critical care time)  Medications Ordered in ED Medications  ibuprofen (ADVIL) tablet 600 mg (600 mg Oral Given 06/25/19 1306)     Initial Impression / Assessment and Plan / ED Course  I have reviewed the triage vital signs and the nursing notes.  Pertinent labs & imaging results that were available during my care of the patient were reviewed by me and considered in my medical decision making (see chart for details).     Final Clinical Impressions(s) / ED Diagnoses   Final diagnoses:  Abnormal uterine bleeding (AUB)   Pt is a 43 y/o female with a h/o anxiety, asthma, GERD, who presents to the ED today for eval of vaginal bleeding ongoing for several weeks. Had IUD placed and started on OCPs for bleeding. IUD not identified on [place] by OB-GYN.   Reviewed records from her visit with OB/GYN.  They were concerned for possible perforation from IUD versus the possibility the IUD fell out spontaneously.  MD favored that IUD fell out spontaneously but order x-rays to rule out uterine perforation.  These have not been completed.  Cbc with mild anemia but stable from prior today.  CMP nonacute UA with hematuria but no evidence for UTI.  Beta hcg negative  Xray of the abd/pelvis did not show evidence of an IUD.   With minimal tenderness on exam I have low suspicion of uterine perforation this time. I will discuss with OB-GYN.  3:00 PM CONSULT with Dr. Hulan Fray with OB-GYN. She recommends starting megace and having pt f/u with OB-GYN.  Discussed results and plan with patient.  Advised her to stop taking birth control and start taking Megace as directed.  Advised her to return to the ED for new or worsening symptoms.  Advised  to follow-up with gynecology.  She voices understanding of the plan and reasons to return.  All questions answered.  Patient is over discharge.   ED Discharge Orders         Ordered    megestrol (MEGACE) 40 MG tablet  Daily     06/25/19 [address], [place], PA-C 06/25/19 1517    Charlesetta Shanks, MD 06/29/19 [phone]

## 2019-06-25 NOTE — Telephone Encounter (Signed)
Krisy left a voice message this am and states she was seen 06/07/19 to get IUD removed; states Dr.couldn't find it- then on US showed it wasn't there. States he started her on orthotricyclen which she started 06/08/19. States she has been bleeding 6 days now , passing clots, bleeding heavily with cramps. States she needs to know if she needs to continue taking pills and can she be given something to stop the bleeding. States she Murlean Caller got the IUD because of anemia due to heavy cycles. States last hemoglobin is 10 she thinks. She also states I thinks he said if the pills are too strong might make you bleed.  Linda,RN

## 2019-07-01 ENCOUNTER — Ambulatory Visit (HOSPITAL_COMMUNITY): Payer: BC Managed Care – PPO

## 2019-07-10 DIAGNOSIS — Z20828 Contact with and (suspected) exposure to other viral communicable diseases: Secondary | ICD-10-CM | POA: Diagnosis not present

## 2019-07-23 ENCOUNTER — Encounter: Payer: Self-pay | Admitting: Gastroenterology

## 2019-07-23 ENCOUNTER — Ambulatory Visit (INDEPENDENT_AMBULATORY_CARE_PROVIDER_SITE_OTHER): Payer: BC Managed Care – PPO | Admitting: Gastroenterology

## 2019-07-23 VITALS — BP 122/84 | HR 76 | Temp 98.4°F | Ht 67.5 in | Wt 164.1 lb

## 2019-07-23 DIAGNOSIS — K219 Gastro-esophageal reflux disease without esophagitis: Secondary | ICD-10-CM

## 2019-07-23 DIAGNOSIS — Z1159 Encounter for screening for other viral diseases: Secondary | ICD-10-CM

## 2019-07-23 DIAGNOSIS — K5909 Other constipation: Secondary | ICD-10-CM | POA: Diagnosis not present

## 2019-07-23 DIAGNOSIS — D509 Iron deficiency anemia, unspecified: Secondary | ICD-10-CM | POA: Diagnosis not present

## 2019-07-23 DIAGNOSIS — R198 Other specified symptoms and signs involving the digestive system and abdomen: Secondary | ICD-10-CM

## 2019-07-23 DIAGNOSIS — Z800 Family history of malignant neoplasm of digestive organs: Secondary | ICD-10-CM

## 2019-07-23 DIAGNOSIS — R0989 Other specified symptoms and signs involving the circulatory and respiratory systems: Secondary | ICD-10-CM | POA: Diagnosis not present

## 2019-07-23 MED ORDER — SUPREP BOWEL PREP KIT 17.5-3.13-1.6 GM/177ML PO SOLN: ORAL | 0 refills | Status: DC

## 2019-07-23 MED ORDER — PANTOPRAZOLE SODIUM 40 MG PO TBEC: 40.0000 mg | DELAYED_RELEASE_TABLET | Freq: Two times a day (BID) | ORAL | 2 refills | Status: DC

## 2019-07-23 MED ORDER — LINACLOTIDE 145 MCG PO CAPS: ORAL_CAPSULE | ORAL | 0 refills | Status: DC

## 2019-07-23 NOTE — Patient Instructions (Addendum)
If you are age 44 or older, your body mass index should be between 23-30. Your Body mass index is 25.33 kg/m. If this is out of the aforementioned range listed, please consider follow up with your Primary Care Provider.  If you are age 56 or younger, your body mass index should be between 19-25. Your Body mass index is 25.33 kg/m. If this is out of the aformentioned range listed, please consider follow up with your Primary Care Provider.   To help prevent the possible spread of infection to our patients, communities, and staff; we will be implementing the following measures:  As of now we are not allowing any visitors/family members to accompany you to any upcoming appointments with [facility]. If you have any concerns about this please contact our office to discuss prior to the appointment.    You have been scheduled for an endoscopy and colonoscopy. Please follow the written instructions given to you at your visit today. Please pick up your prep supplies at the pharmacy within the next 1-3 days. If you use inhalers (even only as needed), please bring them with you on the day of your procedure.  We have sent the following medications to your pharmacy for you to pick up at your convenience: Protonix 40mg : Take twice a day  We are giving you samples of Linzess 128mcg today.  Take one to two capsules one hour before your first meal.    Thank you for entrusting me with your care and for choosing [facility], Dr. [place] Cellar

## 2019-07-23 NOTE — Progress Notes (Signed)
HPI :  43 year old female with a history of iron deficiency anemia, chronic constipation, GERD, referred here by Aldine Contes MD for globus, GERD, iron deficiency.  She states she has had issues with globus intermittently for years.  She previously had an EGD in 2015 which was normal.  Since March, she states she developed a severe sore throat and since that time has had ongoing issues with globus.  She denies any dysphagia or odynophagia.  She states she feels she chokes on her secretions at night which bother her and she is been having a lot of heartburn and regurgitation lately.  She also endorses a decreased appetite for the past few weeks.  No early satiety.  She does have some ongoing nausea but no vomiting.  She also endorses worsening fatigue.  She was seen by an ENT who performed a laryngoscopy for her, she states there were some erosions that were noted consistent with reflux, I do not have the report of that available.  She was given Protonix 20 mg once a day.  She states this has not really helped her too much yet.  Otherwise she has chronic constipation ongoing for years.  She denies any abdominal pains.  She denies any blood in the stool however she has superficial bleeding at times.  She uses Dulcolax once every 3 days to help her have a bowel movement.  She on average has 2 bowel movements a week.  She has used high-dose MiraLAX in the past, she states it takes a lot of MiraLAX to make her go.  She has not tried anything else.  Her brother was diagnosed with rectal cancer last year at the age of 77.  Her aunt has esophageal cancer.  She is had a history of iron deficiency anemia dating back since 2017.  She is received IV iron infusions in the past.  She says it has been presumed this is been due to her menstrual cycle, and considering hysterectomy, however she states her cycle is varied and is not consistently heavy.  EGD 05/11/2014 - normal, done for globus Colonoscopy 2008 -  normal  Past Medical History:  Diagnosis Date  . Anemia   . Anxiety   . Asthma    With Exacerbations  . Constipation    SP normal colonoscopy 7/08, GI lebaur Dr. Elicia Lamp.  Marland Kitchen GERD (gastroesophageal reflux disease)   . Hepatitis B immune   . Palpitations    Chronic     Past Surgical History:  Procedure Laterality Date  . CESAREAN SECTION     Multiple  . Tubal ligation  2006   Family History  Problem Relation Age of Onset  . Hypertension Mother   . Colon polyps Mother   . Diabetes Mother   . Heart disease Mother   . BRCA 1/2 Maternal Grandmother 80  . Throat cancer Maternal Aunt   . Lung cancer Maternal Grandfather   . Rectal cancer Brother    Social History   Tobacco Use  . Smoking status: Former Smoker    Packs/day: 0.10    Types: Cigarettes    Quit date: 07/22/2009    Years since quitting: 10.0  . Smokeless tobacco: Never Used  Substance Use Topics  . Alcohol use: Yes    Alcohol/week: 0.0 standard drinks    Comment: Only socially.   . Drug use: No   Current Outpatient Medications  Medication Sig Dispense Refill  . albuterol (VENTOLIN HFA) 108 (90 Base) MCG/ACT inhaler Inhale 2  puffs into the lungs every 6 (six) hours as needed for wheezing or shortness of breath. 3 Inhaler 3  . ibuprofen (ADVIL) 200 MG tablet Take 400 mg by mouth every 6 (six) hours as needed (Pain).    . iron polysaccharides (NU-IRON) 150 MG capsule Take 1 capsule (150 mg total) by mouth daily. 90 capsule 3  . megestrol (MEGACE) 40 MG tablet Take 1 tablet (40 mg total) by mouth daily. 30 tablet 0  . pantoprazole (PROTONIX) 20 MG tablet Take 1 tablet (20 mg total) by mouth daily. 30 tablet 2   No current facility-administered medications for this visit.    No Known Allergies   Review of Systems: All systems reviewed and negative except where noted in HPI.    Dg Pelvis 1-2 Views  Result Date: 06/25/2019 CLINICAL DATA:  Evaluate for IUD EXAM: PELVIS - 1-2 VIEW COMPARISON:  None.  FINDINGS: There is no evidence of pelvic fracture or diastasis. No pelvic bone lesions are seen. Small calcified pelvic phlebolith. No IUD or other radiopaque foreign bodies identified within the pelvis. IMPRESSION: No IUD visualized. Electronically Signed   By: Davina Poke M.D.   On: 06/25/2019 13:01   Dg Abdomen 1 View  Result Date: 06/25/2019 CLINICAL DATA:  Lost IUD. EXAM: ABDOMEN - 1 VIEW COMPARISON:  None. FINDINGS: The bowel gas pattern is normal. No IUD visualized within the abdomen or pelvis. IMPRESSION: Normal bowel gas pattern. No IUD visualized. Electronically Signed   By: Marlaine Hind M.D.   On: 06/25/2019 13:02   CBC Latest Ref Rng & Units 06/25/2019 06/07/2019 09/18/2018  WBC 4.0 - 10.5 K/uL 6.1 5.6 4.6  Hemoglobin 12.0 - 15.0 g/dL 10.6(L) 10.5(L) 11.7  Hematocrit 36.0 - 46.0 % 33.6(L) 32.7(L) 34.3  Platelets 150 - 400 K/uL 391 365 307    Lab Results  Component Value Date   IRON 37 09/06/2016   TIBC 511 (H) 09/06/2016   FERRITIN 10 (L) 09/18/2018      Physical Exam: BP 122/84 (BP Location: Left Arm, Patient Position: Sitting, Cuff Size: Normal)   Pulse 76   Temp 98.4 F (36.9 C)   Ht 5' 7.5" (1.715 m) Comment: height measured without shoes  Wt 164 lb 2 oz (74.4 kg)   BMI 25.33 kg/m  Constitutional: Pleasant,well-developed, female in no acute distress. HEENT: Normocephalic and atraumatic. Conjunctivae are normal. No scleral icterus. Neck supple.  Cardiovascular: Normal rate, regular rhythm.  Pulmonary/chest: Effort normal and breath sounds normal. No wheezing, rales or rhonchi. Abdominal: Soft, nondistended, nontender.  There are no masses palpable. No hepatomegaly. Extremities: no edema Lymphadenopathy: No cervical adenopathy noted. Neurological: Alert and oriented to person place and time. Skin: Skin is warm and dry. No rashes noted. Psychiatric: Normal mood and affect. Behavior is normal.   ASSESSMENT AND PLAN: 43 year old female here for new patient  assessment of the following:  GERD / Globus - symptoms of globus could certainly be related to reflux.  I discussed differential with her.  She is having ongoing reflux symptoms that bother her and is not well controlled on Protonix 20 mg a day.  Will increase Protonix to 40 mg twice daily for the next month and see if this helps her.  I offered her from Zofran to use as needed for nausea.  Ultimately in light of her iron deficiency that has developed since her last exam, and her ongoing symptoms despite PPI, recommend EGD to further evaluate.  I discussed risks and benefits of endoscopy and anesthesia  and she want to proceed.  Further recommendations pending results and her course.  Iron deficiency anemia / Chronic constipation / FH CRC - ongoing iron deficiency anemia leading to IV iron infusions in the past.  She has stable chronic constipation with superficial bleeding.  Her brother was recently diagnosed with rectal cancer at age 65.  In light of these issues I am recommending a colonoscopy to further evaluate.  This will be done at the same time as her endoscopy.  She wanted to proceed after discussion of risks and benefits.  In the interim I offered her Linzess for constipation, 145 mcg once to twice daily.  If this works for her we will place a prescription.  She agreed.  [place] Cellar, MD [facility]  CC: Aldine Contes, MD

## 2019-07-26 ENCOUNTER — Telehealth: Payer: Self-pay | Admitting: Emergency Medicine

## 2019-07-26 ENCOUNTER — Encounter: Payer: Self-pay | Admitting: Gastroenterology

## 2019-07-26 NOTE — Telephone Encounter (Signed)
Pt called and left a message on the nurse voicemail line stating she has an appointment for 11/9 because of uterine bleeding she has had and for her IUD falling out. Pt states she is unable to make the appointment on 11/9 and is unsure of when she can reschedule the appointment for, but she needs her megace refilled.

## 2019-07-27 ENCOUNTER — Other Ambulatory Visit: Payer: Self-pay | Admitting: Gastroenterology

## 2019-07-27 ENCOUNTER — Other Ambulatory Visit: Payer: Self-pay | Admitting: Family Medicine

## 2019-07-27 DIAGNOSIS — Z1159 Encounter for screening for other viral diseases: Secondary | ICD-10-CM | POA: Diagnosis not present

## 2019-07-27 LAB — SARS CORONAVIRUS 2 (TAT 6-24 HRS): SARS Coronavirus 2: NEGATIVE

## 2019-07-27 MED ORDER — MEGESTROL ACETATE 40 MG PO TABS: 40.0000 mg | ORAL_TABLET | Freq: Every day | ORAL | 0 refills | Status: DC

## 2019-07-27 NOTE — Telephone Encounter (Signed)
I called Megan Richardson to discuss her question. She states she needs to cancel the 11/9 appointment because she has more urgent needs with GI that she is getting taken care of. States she will call back for appointment once that is settled. She states the megace she was given did take care of the bleeding and she doesn't want it to start back ; but needs a refill. I explained I will route her refill request to provider she saw in our office; but he is not here today ; and once we hear back from him; we will let her know. She voices understanding. Linda,RN

## 2019-07-27 NOTE — Progress Notes (Signed)
Refill x1 month given, needs to reschedule appt to see provider to discuss long term treatment of AUB

## 2019-07-28 NOTE — Progress Notes (Signed)
Patient has been notified, see telephone note.  Linda,RN

## 2019-07-28 NOTE — Telephone Encounter (Signed)
I called Mirayah and notified her Dr.Eckstat has sent in a refill of her megace for one month and he requests she schedule a follow up appointment with provider to discuss long term treatment of AUB. She voices understanding. Latacha Texeira,RN

## 2019-07-29 ENCOUNTER — Encounter: Payer: Self-pay | Admitting: Gastroenterology

## 2019-07-29 ENCOUNTER — Other Ambulatory Visit: Payer: Self-pay

## 2019-07-29 ENCOUNTER — Ambulatory Visit (AMBULATORY_SURGERY_CENTER): Payer: BC Managed Care – PPO | Admitting: Gastroenterology

## 2019-07-29 VITALS — BP 154/100 | HR 70 | Temp 98.4°F | Resp 38 | Ht 67.0 in | Wt 164.0 lb

## 2019-07-29 DIAGNOSIS — Z800 Family history of malignant neoplasm of digestive organs: Secondary | ICD-10-CM | POA: Diagnosis not present

## 2019-07-29 DIAGNOSIS — K219 Gastro-esophageal reflux disease without esophagitis: Secondary | ICD-10-CM

## 2019-07-29 DIAGNOSIS — K2950 Unspecified chronic gastritis without bleeding: Secondary | ICD-10-CM

## 2019-07-29 DIAGNOSIS — D125 Benign neoplasm of sigmoid colon: Secondary | ICD-10-CM | POA: Diagnosis not present

## 2019-07-29 DIAGNOSIS — D509 Iron deficiency anemia, unspecified: Secondary | ICD-10-CM | POA: Diagnosis not present

## 2019-07-29 DIAGNOSIS — D122 Benign neoplasm of ascending colon: Secondary | ICD-10-CM | POA: Diagnosis not present

## 2019-07-29 DIAGNOSIS — D123 Benign neoplasm of transverse colon: Secondary | ICD-10-CM

## 2019-07-29 DIAGNOSIS — K317 Polyp of stomach and duodenum: Secondary | ICD-10-CM

## 2019-07-29 MED ORDER — SODIUM CHLORIDE 0.9 % IV SOLN: 500.0000 mL | INTRAVENOUS | Status: DC

## 2019-07-29 NOTE — Progress Notes (Signed)
Temp per June VS per E. Chrissie Noa

## 2019-07-29 NOTE — Op Note (Signed)
[place] Patient Name: Megan Richardson Procedure Date: 07/29/2019 3:04 PM MRN: [id number] Endoscopist: Remo Lipps P. Havery Moros , MD Age: 43 Referring MD:  Date of Birth: 1975/11/04 Gender: Female Account #: [id number] Procedure:                Colonoscopy Indications:              Iron deficiency anemia, brother with colon cancer Medicines:                Monitored Anesthesia Care Procedure:                Pre-Anesthesia Assessment:                           - Prior to the procedure, a History and Physical                            was performed, and patient medications and                            allergies were reviewed. The patient's tolerance of                            previous anesthesia was also reviewed. The risks                            and benefits of the procedure and the sedation                            options and risks were discussed with the patient.                            All questions were answered, and informed consent                            was obtained. Prior Anticoagulants: The patient has                            taken no previous anticoagulant or antiplatelet                            agents. ASA Grade Assessment: II - A patient with                            mild systemic disease. After reviewing the risks                            and benefits, the patient was deemed in                            satisfactory condition to undergo the procedure.                           After obtaining informed consent, the colonoscope  was passed under direct vision. Throughout the                            procedure, the patient's blood pressure, pulse, and                            oxygen saturations were monitored continuously. The                            Colonoscope was introduced through the anus and                            advanced to the the terminal ileum, with                            identification  of the appendiceal orifice and IC                            valve. The colonoscopy was performed without                            difficulty. The patient tolerated the procedure                            well. The quality of the bowel preparation was                            good. The terminal ileum, ileocecal valve,                            appendiceal orifice, and rectum were photographed. Scope In: 3:04:05 PM Scope Out: 3:30:54 PM Scope Withdrawal Time: 0 hours 22 minutes 27 seconds  Total Procedure Duration: 0 hours 26 minutes 49 seconds  Findings:                 The perianal and digital rectal examinations were                            normal.                           The terminal ileum appeared normal.                           A 3 mm polyp was found in the ascending colon. The                            polyp was sessile. The polyp was removed with a                            cold snare. Resection and retrieval were complete.                           Four sessile polyps were found in the transverse  colon. The polyps were 3 to 4 mm in size. These                            polyps were removed with a cold snare. Resection                            and retrieval were complete.                           A 4 mm polyp was found in the sigmoid colon. The                            polyp was sessile. The polyp was removed with a                            cold snare. Resection and retrieval were complete.                           A 10 mm polyp was found in the sigmoid colon. The                            polyp was pedunculated with inflammatory changes.                            The polyp was removed with a hot snare. Resection                            and retrieval were complete.                           An area of mucosa was found in the distal rectum /                            dentate line which appeared slightly altered versus                             normal variant. Biopsies were taken with a cold                            forceps for histology to rule out AIN.                           The exam was otherwise without abnormality. Complications:            No immediate complications. Estimated blood loss:                            Minimal. Estimated Blood Loss:     Estimated blood loss was minimal. Impression:               - The examined portion of the ileum was normal.                           -  One 3 mm polyp in the ascending colon, removed                            with a cold snare. Resected and retrieved.                           - Four 3 to 4 mm polyps in the transverse colon,                            removed with a cold snare. Resected and retrieved.                           - One 4 mm polyp in the sigmoid colon, removed with                            a cold snare. Resected and retrieved.                           - One 10 mm polyp in the sigmoid colon, removed                            with a hot snare. Resected and retrieved.                           - Abnormal mucosa in the distal rectum / dentate                            line - could be normal varient but biopsied to rule                            out AIN.                           - The examination was otherwise normal. Recommendation:           - Patient has a contact number available for                            emergencies. The signs and symptoms of potential                            delayed complications were discussed with the                            patient. Return to normal activities tomorrow.                            Written discharge instructions were provided to the                            patient.                           - Resume previous diet.                           -  Continue present medications.                           - Await pathology results.                           - No ibuprofen, naproxen, or other  non-steroidal                            anti-inflammatory drugs for 2 weeks after polyp                            removal. Remo Lipps P. Margie Urbanowicz, MD 07/29/2019 3:39:35 PM This report has been signed electronically.

## 2019-07-29 NOTE — Patient Instructions (Signed)
No aspirin, aleve or ibuprofen for 2 weeks.  Please read all of the handouts given to you by your recovery room nurse.   Thank-you for choosing [place] for your healthcare needs today.  YOU HAD AN ENDOSCOPIC PROCEDURE TODAY AT [facility]:   Refer to the procedure report that was given to you for any specific questions about what was found during the examination.  If the procedure report does not answer your questions, please call your gastroenterologist to clarify.  If you requested that your care partner not be given the details of your procedure findings, then the procedure report has been included in a sealed envelope for you to review at your convenience later.  YOU SHOULD EXPECT: Some feelings of bloating in the abdomen. Passage of more gas than usual.  Walking can help get rid of the air that was put into your GI tract during the procedure and reduce the bloating. If you had a lower endoscopy (such as a colonoscopy or flexible sigmoidoscopy) you may notice spotting of blood in your stool or on the toilet paper. If you underwent a bowel prep for your procedure, you may not have a normal bowel movement for a few days.  Please Note:  You might notice some irritation and congestion in your nose or some drainage.  This is from the oxygen used during your procedure.  There is no need for concern and it should clear up in a day or so.  SYMPTOMS TO REPORT IMMEDIATELY:   Following lower endoscopy (colonoscopy or flexible sigmoidoscopy):  Excessive amounts of blood in the stool  Significant tenderness or worsening of abdominal pains  Swelling of the abdomen that is new, acute  Fever of 100F or higher   Following upper endoscopy (EGD)  Vomiting of blood or coffee ground material  New chest pain or pain under the shoulder blades  Painful or persistently difficult swallowing  New shortness of breath  Fever of 100F or higher  Black, tarry-looking stools  For urgent or emergent  issues, a gastroenterologist can be reached at any hour by calling [phone].   DIET:  We do recommend a small meal at first, but then you may proceed to your regular diet.  Drink plenty of fluids but you should avoid alcoholic beverages for 24 hours.  ACTIVITY:  You should plan to take it easy for the rest of today and you should NOT DRIVE or use heavy machinery until tomorrow (because of the sedation medicines used during the test).    FOLLOW UP: Our staff will call the number listed on your records 48-72 hours following your procedure to check on you and address any questions or concerns that you may have regarding the information given to you following your procedure. If we do not reach you, we will leave a message.  We will attempt to reach you two times.  During this call, we will ask if you have developed any symptoms of COVID 19. If you develop any symptoms (ie: fever, flu-like symptoms, shortness of breath, cough etc.) before then, please call [phone].  If you test positive for Covid 19 in the 2 weeks post procedure, please call and report this information to [place].    If any biopsies were taken you will be contacted by phone or by letter within the next 1-3 weeks.  Please call us at [phone] if you have not heard about the biopsies in 3 weeks.    SIGNATURES/CONFIDENTIALITY: You and/or your care  partner have signed paperwork which will be entered into your electronic medical record.  These signatures attest to the fact that that the information above on your After Visit Summary has been reviewed and is understood.  Full responsibility of the confidentiality of this discharge information lies with you and/or your care-partner.

## 2019-07-29 NOTE — Progress Notes (Signed)
Report to PACU, RN, vss, BBS= Clear.  

## 2019-07-29 NOTE — Op Note (Signed)
[place] Patient Name: Megan Richardson Procedure Date: 07/29/2019 2:52 PM MRN: [id number] Endoscopist: Remo Lipps P. Havery Moros , MD Age: 43 Referring MD:  Date of Birth: [birth year] Gender: Female Account #: [id number] Procedure:                Upper GI endoscopy Indications:              Iron deficiency anemia, GERD / globus on omeprazole                            low dose Medicines:                Monitored Anesthesia Care Procedure:                Pre-Anesthesia Assessment:                           - Prior to the procedure, a History and Physical                            was performed, and patient medications and                            allergies were reviewed. The patient's tolerance of                            previous anesthesia was also reviewed. The risks                            and benefits of the procedure and the sedation                            options and risks were discussed with the patient.                            All questions were answered, and informed consent                            was obtained. Prior Anticoagulants: The patient has                            taken no previous anticoagulant or antiplatelet                            agents. ASA Grade Assessment: II - A patient with                            mild systemic disease. After reviewing the risks                            and benefits, the patient was deemed in                            satisfactory condition to undergo the procedure.  After obtaining informed consent, the endoscope was                            passed under direct vision. Throughout the                            procedure, the patient's blood pressure, pulse, and                            oxygen saturations were monitored continuously. The                            Endoscope was introduced through the mouth, and                            advanced to the second part of duodenum.  The upper                            GI endoscopy was accomplished without difficulty.                            The patient tolerated the procedure well. Scope In: Scope Out: Findings:                 Esophagogastric landmarks were identified: the                            Z-line was found at 39 cm, the gastroesophageal                            junction was found at 39 cm and the upper extent of                            the gastric folds was found at 39 cm from the                            incisors.                           The exam of the esophagus was otherwise normal.                           Biopsies were taken with a cold forceps in the                            upper third of the esophagus, in the middle third                            of the esophagus and in the lower third of the                            esophagus for histology to rule out EoE.  The entire examined stomach was normal. Biopsies                            were taken with a cold forceps for Helicobacter                            pylori testing.                           A single diminutive sessile polyp was found in the                            second portion of the duodenum. The polyp was                            removed with a cold biopsy forceps. Resection and                            retrieval were complete.                           The exam of the duodenum was otherwise normal. Complications:            No immediate complications. Estimated blood loss:                            Minimal. Estimated Blood Loss:     Estimated blood loss was minimal. Impression:               - Esophagogastric landmarks identified.                           - Normal esophagus - biopsies taken to rule out EoE                           - Normal stomach. Biopsied to rule out H pylori.                           - A single duodenal polyp. Resected and retrieved.                            - Normal duodenum otherwise. Recommendation:           - Patient has a contact number available for                            emergencies. The signs and symptoms of potential                            delayed complications were discussed with the                            patient. Return to normal activities tomorrow.                            Written  discharge instructions were provided to the                            patient.                           - Resume previous diet.                           - Continue present medications and trial of higher                            dose omeprazole as previously discussed.                           - Await pathology results. Remo Lipps P. Xander Jutras, MD 07/29/2019 3:43:59 PM This report has been signed electronically.

## 2019-07-29 NOTE — Progress Notes (Signed)
Called to room to assist during endoscopic procedure.  Patient ID and intended procedure confirmed with present staff. Received instructions for my participation in the procedure from the performing physician.  

## 2019-07-30 ENCOUNTER — Telehealth: Payer: Self-pay | Admitting: Gastroenterology

## 2019-07-30 NOTE — Telephone Encounter (Signed)
[facility] MD: [place]  After hours call re: nasal congestion and Covid Exposure  Tested negative for Covid 07/27/19 in preparation for endoscopy. Works in a surgical office. Learned 07/28/19 that she had exposure to a patient who was Covid positive while working 07/27/19. Thought to be low risk exposure and no further evaluation recommended. Developed left "nostril burning" in the recovery room after endoscopy 07/29/19. Patient reports it was explained as likely related to nasal cannula oxygen by her recovery nurse. Noted progressive worsening of rhinorrhea today and wondered if she could continue to blame the oxygen she received with endoscopy or if she needed to be tested for Covid.  As I do not expect her to have ongoing rhinorrhea, I asked her to contact her PCP this evening to determine if additional testing and/or treatment for Covid was necessary. I asked her to notify our office if she is ultimately diagnosed with Covid.

## 2019-07-31 DIAGNOSIS — Z20828 Contact with and (suspected) exposure to other viral communicable diseases: Secondary | ICD-10-CM | POA: Diagnosis not present

## 2019-08-02 ENCOUNTER — Telehealth: Payer: Self-pay | Admitting: Gastroenterology

## 2019-08-02 ENCOUNTER — Other Ambulatory Visit: Payer: Self-pay

## 2019-08-02 ENCOUNTER — Telehealth: Payer: Self-pay | Admitting: *Deleted

## 2019-08-02 ENCOUNTER — Encounter: Payer: Self-pay | Admitting: Obstetrics and Gynecology

## 2019-08-02 ENCOUNTER — Ambulatory Visit (INDEPENDENT_AMBULATORY_CARE_PROVIDER_SITE_OTHER): Payer: BC Managed Care – PPO | Admitting: Obstetrics and Gynecology

## 2019-08-02 VITALS — BP 130/89 | HR 67 | Temp 98.6°F | Ht 67.0 in | Wt 168.3 lb

## 2019-08-02 DIAGNOSIS — Z1231 Encounter for screening mammogram for malignant neoplasm of breast: Secondary | ICD-10-CM | POA: Diagnosis not present

## 2019-08-02 DIAGNOSIS — N939 Abnormal uterine and vaginal bleeding, unspecified: Secondary | ICD-10-CM | POA: Diagnosis not present

## 2019-08-02 MED ORDER — TRANEXAMIC ACID 650 MG PO TABS: 1300.0000 mg | ORAL_TABLET | Freq: Three times a day (TID) | ORAL | 2 refills | Status: DC

## 2019-08-02 MED ORDER — LINACLOTIDE 145 MCG PO CAPS: 145.0000 ug | ORAL_CAPSULE | Freq: Every day | ORAL | 5 refills | Status: DC

## 2019-08-02 NOTE — Telephone Encounter (Signed)
  Follow up Call-  Call back number 07/29/2019  Post procedure Call Back phone  # [phone]  Permission to leave phone message Yes  Some recent data might be hidden     Patient questions:  Do you have a fever, pain , or abdominal swelling? No. Pain Score  0 *  Have you tolerated food without any problems? Yes.    Have you been able to return to your normal activities? Yes.    Do you have any questions about your discharge instructions: Diet   No. Medications  No. Follow up visit  No.  Do you have questions or concerns about your Care? No.  Actions: * If pain score is 4 or above: No action needed, pain <4. 1. Have you developed a fever since your procedure? no  2.   Have you had an respiratory symptoms (SOB or cough) since your procedure? no  3.   Have you tested positive for COVID 19 since your procedure no  4.   Have you had any family members/close contacts diagnosed with the COVID 19 since your procedure?  Yes  But pt was tested again Friday and the test was negative this morning.   If yes to any of these questions please route to Joylene John, RN and Alphonsa Gin, RN.

## 2019-08-02 NOTE — Telephone Encounter (Signed)
Linzess 145 mcg sent to pharmacy.  

## 2019-08-02 NOTE — Telephone Encounter (Signed)
Thank you Analeigha for calling this patient and recommendations.

## 2019-08-02 NOTE — Telephone Encounter (Signed)
Pt reported that Octa samples worked well for her and she requested a prescription sent to pharmacy.

## 2019-08-02 NOTE — Progress Notes (Signed)
GYNECOLOGY OFFICE FOLLOW UP NOTE  History:  43 y.o. [id number] here today for follow up for AUB. Had Mirena IUD placed 11/2018 for heavy bleeding. Her bleeding never improved and she came for IUD removal, however, IUD was not in place when she presented for removal.   Started on OCPs in September 05/2019, started bleeding about 6 weeks after she started that and it has not stopped since. Went to ED and she was prescribed Megace after, which has lightened bleeding considerably, but has not stopped it.     Past Medical History:  Diagnosis Date  . Anemia   . Anxiety   . Asthma    With Exacerbations  . Constipation    SP normal colonoscopy 7/08, GI lebaur Dr. Elicia Lamp.  Marland Kitchen GERD (gastroesophageal reflux disease)   . Hepatitis B immune   . Palpitations    Chronic    Past Surgical History:  Procedure Laterality Date  . CESAREAN SECTION     Multiple  . COLONOSCOPY    . Tubal ligation  2006     Current Outpatient Medications:  .  albuterol (VENTOLIN HFA) 108 (90 Base) MCG/ACT inhaler, Inhale 2 puffs into the lungs every 6 (six) hours as needed for wheezing or shortness of breath., Disp: 3 Inhaler, Rfl: 3 .  ibuprofen (ADVIL) 200 MG tablet, Take 400 mg by mouth every 6 (six) hours as needed (Pain)., Disp: , Rfl:  .  iron polysaccharides (NU-IRON) 150 MG capsule, Take 1 capsule (150 mg total) by mouth daily., Disp: 90 capsule, Rfl: 3 .  linaclotide (LINZESS) 145 MCG CAPS capsule, Take 1 capsule (145 mcg total) by mouth daily before breakfast., Disp: 30 capsule, Rfl: 5 .  megestrol (MEGACE) 40 MG tablet, Take 1 tablet (40 mg total) by mouth daily., Disp: 30 tablet, Rfl: 0 .  pantoprazole (PROTONIX) 40 MG tablet, Take 1 tablet (40 mg total) by mouth 2 (two) times daily., Disp: 60 tablet, Rfl: 2 .  tranexamic acid (LYSTEDA) 650 MG TABS tablet, Take 2 tablets (1,300 mg total) by mouth 3 (three) times daily. Take during menses for a maximum of five days, Disp: 30 tablet, Rfl: 2  The following  portions of the patient's history were reviewed and updated as appropriate: allergies, current medications, past family history, past medical history, past social history, past surgical history and problem list.   Review of Systems:  Pertinent items noted in HPI and remainder of comprehensive ROS otherwise negative.   Objective:  Physical Exam BP 130/89 (BP Location: Left Arm, Patient Position: Sitting, Cuff Size: Normal)   Pulse 67   Temp 98.6 F (37 C) (Oral)   Ht 5\' 7"  (1.702 m)   Wt 168 lb 4.8 oz (76.3 kg)   BMI 26.36 kg/m  CONSTITUTIONAL: Well-developed, well-nourished female in no acute distress.  HENT:  Normocephalic, atraumatic. External right and left ear normal. Oropharynx is clear and moist EYES: Conjunctivae and EOM are normal. Pupils are equal, round, and reactive to light. No scleral icterus.  NECK: Normal range of motion, supple, no masses SKIN: Skin is warm and dry. No rash noted. Not diaphoretic. No erythema. No pallor. NEUROLOGIC: Alert and oriented to person, place, and time. Normal reflexes, muscle tone coordination. No cranial nerve deficit noted. PSYCHIATRIC: Normal mood and affect. Normal behavior. Normal judgment and thought content. CARDIOVASCULAR: Normal heart rate noted RESPIRATORY: Effort normal, no problems with respiration noted ABDOMEN: Soft, no distention noted.   PELVIC: deferred MUSCULOSKELETAL: Normal range of motion. No edema noted.  Exam done with chaperone present.  Labs and Imaging No results found.  Assessment & Plan:   1. Abnormal uterine bleeding (AUB) Reviewed options for management including expectant, PO hormonal management, non-hormonal management, IUD, surgical options, reviewed risks/benefits - she would like to start with expectant management as she would like to see what her cycles are like without hormones but is worried about heavy bleeding - will plan for expectant management but give lysteda in case bleeding becomes heavy -  return 3 months for follow up - pap at that time  2. Encounter for screening mammogram for malignant neoplasm of breast Due for mammogram   Routine preventative health maintenance measures emphasized. Please refer to After Visit Summary for other counseling recommendations.   Return in about 3 months (around 11/02/2019) for Followup, pap.  Total face-to-face time with patient: 15 minutes. Over 50% of encounter was spent on counseling and coordination of care.  Feliz Beam, M.D. Attending Center for [name] [facility])

## 2019-08-03 ENCOUNTER — Telehealth: Payer: Self-pay | Admitting: Gastroenterology

## 2019-08-03 NOTE — Telephone Encounter (Signed)
Called and spoke to pt.  She is out of samples.  I let her know I submitted the PA but have not heard back yet.  Will put 2 boxes of the Linzess 53mcgs up front and she can take two capsules daily before breakfast until we hear back regarding the PA.

## 2019-08-05 NOTE — Telephone Encounter (Signed)
Reference # [facility]  [employer] has denied patient's Linzess. They have indicated that patient "does not regularly require the use of laxative medications (such as bisacodyl, magnesium citrate, or polyethylene glycol) to have loose stools AND she must have tried and failed Trulance prior to approval of Linzess.  Dr Havery Moros, may we try Trulance?

## 2019-08-05 NOTE — Telephone Encounter (Signed)
Yes let's give her sample of Trulance and see if that works. If it does, then we can prescribe it. Thanks for your help

## 2019-08-06 ENCOUNTER — Other Ambulatory Visit: Payer: Self-pay

## 2019-08-06 DIAGNOSIS — D509 Iron deficiency anemia, unspecified: Secondary | ICD-10-CM

## 2019-08-06 NOTE — Telephone Encounter (Signed)
Called pt.  I am leaving a sample of Trulance at the front desk. Pt will pick up today and let us know if it is effective.

## 2019-08-09 ENCOUNTER — Telehealth: Payer: Self-pay | Admitting: Gastroenterology

## 2019-08-09 MED ORDER — TRULANCE 3 MG PO TABS: 3.0000 mg | ORAL_TABLET | Freq: Every day | ORAL | 2 refills | Status: DC

## 2019-08-09 NOTE — Telephone Encounter (Signed)
Script sent  

## 2019-08-10 ENCOUNTER — Telehealth: Payer: Self-pay

## 2019-08-10 NOTE — Telephone Encounter (Signed)
[employer] PA for [employer] denied thru [facility].  Pt has to try Trulance first.  PA for Trulance approved 08-10-19.  Note: insurance card in chart is not accurate - it is for dental insurance.  Correct BC/BS ID is [id number], Group ID: [id number].

## 2019-08-31 ENCOUNTER — Telehealth: Payer: Self-pay | Admitting: Lactation Services

## 2019-08-31 NOTE — Telephone Encounter (Signed)
Pt called and left message on phone line. She reports she has had really heavy bleeding for 2 weeks. She reports she has stopped taking the hormones as they were not helping. She has a few doses of Megace left and has taken in the last few days without decrease in bleeding. She reports she cannot afford the Lysteda so did not get that filled. She would like to know if she can get a refill on Megace to get her through until her follow up appt with Dr. Rosana Hoes.   Message routed to Dr. Rosana Hoes for recommendation.

## 2019-09-02 NOTE — Telephone Encounter (Signed)
Please send Megace refills.

## 2019-09-15 ENCOUNTER — Other Ambulatory Visit (INDEPENDENT_AMBULATORY_CARE_PROVIDER_SITE_OTHER): Payer: BC Managed Care – PPO

## 2019-09-15 DIAGNOSIS — D509 Iron deficiency anemia, unspecified: Secondary | ICD-10-CM | POA: Diagnosis not present

## 2019-09-15 LAB — CBC WITH DIFFERENTIAL/PLATELET
Basophils Absolute: 0.1 10*3/uL (ref 0.0–0.1)
Basophils Relative: 1.1 % (ref 0.0–3.0)
Eosinophils Absolute: 0.1 10*3/uL (ref 0.0–0.7)
Eosinophils Relative: 2 % (ref 0.0–5.0)
HCT: 29.8 % — ABNORMAL LOW (ref 36.0–46.0)
Hemoglobin: 9.8 g/dL — ABNORMAL LOW (ref 12.0–15.0)
Lymphocytes Relative: 47.3 % — ABNORMAL HIGH (ref 12.0–46.0)
Lymphs Abs: 2.4 10*3/uL (ref 0.7–4.0)
MCHC: 32.8 g/dL (ref 30.0–36.0)
MCV: 88.1 fl (ref 78.0–100.0)
Monocytes Absolute: 0.5 10*3/uL (ref 0.1–1.0)
Monocytes Relative: 8.8 % (ref 3.0–12.0)
Neutro Abs: 2.1 10*3/uL (ref 1.4–7.7)
Neutrophils Relative %: 40.8 % — ABNORMAL LOW (ref 43.0–77.0)
Platelets: 476 10*3/uL — ABNORMAL HIGH (ref 150.0–400.0)
RBC: 3.38 Mil/uL — ABNORMAL LOW (ref 3.87–5.11)
RDW: 15 % (ref 11.5–15.5)
WBC: 5.1 10*3/uL (ref 4.0–10.5)

## 2019-09-16 LAB — IRON,TIBC AND FERRITIN PANEL
%SAT: 3 % (calc) — ABNORMAL LOW (ref 16–45)
Ferritin: 4 ng/mL — ABNORMAL LOW (ref 16–232)
Iron: 14 ug/dL — ABNORMAL LOW (ref 40–190)
TIBC: 514 mcg/dL (calc) — ABNORMAL HIGH (ref 250–450)

## 2019-09-21 ENCOUNTER — Other Ambulatory Visit: Payer: Self-pay

## 2019-09-21 ENCOUNTER — Telehealth: Payer: Self-pay

## 2019-09-21 DIAGNOSIS — D509 Iron deficiency anemia, unspecified: Secondary | ICD-10-CM

## 2019-09-21 NOTE — Telephone Encounter (Signed)
-----   Message from Yetta Flock, MD sent at 09/20/2019  5:04 PM EST ----- Sherlynn Stalls can you help relay the following: - iron deficiency anemia is worsening despite oral iron - I did not find a clear cause on EGD and colonoscopy. One inflamed colon polyp that was removed, but given worsening despite removal of the polyp I think that less likely to be causing the issue - looks like she is having some significant menstrual bleeding lately per review of the chart. Can you confirm this with her. If that is the case, suspect that is likely the cause and she needs to follow up with her GYN about this - I do think she warrants IV Iron at this time, a few doses, if you can coordinate for her or if her GYN wants to coordinate it, they can also do it, as I don't think her bowel is driving this process based on workup to date. Thanks

## 2019-09-21 NOTE — Telephone Encounter (Signed)
I called patient and gave results of EGD/Colonoscopy and labs. She has an appt. With her GYN on 09/28/20 and is interested in getting the IV iron. Michela Pitcher it would probably be quicker for [place] to schedule the IV iron, than to have her GYN office schedule it. Do you want her to have 1 or 2 infusions ?

## 2019-09-21 NOTE — Telephone Encounter (Signed)
Patient scheduled for IV Feraheme on 09/28/19 at 8:00am and 10/05/19 at 8:00am at the [facility] [place]. Patient aware of the above.

## 2019-09-21 NOTE — Telephone Encounter (Signed)
Would do 2 infusions. Thanks

## 2019-09-28 ENCOUNTER — Other Ambulatory Visit: Payer: Self-pay

## 2019-09-28 ENCOUNTER — Ambulatory Visit (HOSPITAL_COMMUNITY)
Admission: RE | Admit: 2019-09-28 | Discharge: 2019-09-28 | Disposition: A | Discharge status: Home / Self Care | Payer: BC Managed Care – PPO | Source: Ambulatory Visit | Attending: Internal Medicine | Admitting: Internal Medicine

## 2019-09-28 DIAGNOSIS — D509 Iron deficiency anemia, unspecified: Secondary | ICD-10-CM

## 2019-09-28 MED ORDER — SODIUM CHLORIDE 0.9 % IV SOLN
510.0000 mg | INTRAVENOUS | Status: DC
  Administered 2019-09-28: 510 mg via INTRAVENOUS
  Filled 2019-09-28: qty 17

## 2019-09-28 MED ORDER — SODIUM CHLORIDE 0.9 % IV SOLN
INTRAVENOUS | Status: DC | PRN
  Administered 2019-09-28: 250 mL via INTRAVENOUS

## 2019-09-28 NOTE — Progress Notes (Signed)
PATIENT CARE CENTER NOTE  Diagnosis:  Iron deficiency anemia, unspecified iron deficiency anemia type (D50.9)     Provider:    [place] Cellar, MD   Procedure: IV Feraheme    Note: Patient received Feraheme infusion via PIV. Tolerated well with no adverse reaction. Observed patient for 30 minutes post-infusion. Vital signs stable. Discharge instructions given. Patient to come back next week for second infusion. Alert, oriented and ambulatory at discharge.

## 2019-09-28 NOTE — Discharge Instructions (Signed)

## 2019-09-29 ENCOUNTER — Ambulatory Visit (INDEPENDENT_AMBULATORY_CARE_PROVIDER_SITE_OTHER): Payer: BC Managed Care – PPO | Admitting: Medical

## 2019-09-29 ENCOUNTER — Encounter: Payer: Self-pay | Admitting: Medical

## 2019-09-29 VITALS — BP 129/80 | HR 72 | Ht 67.0 in | Wt 170.5 lb

## 2019-09-29 DIAGNOSIS — Z30430 Encounter for insertion of intrauterine contraceptive device: Secondary | ICD-10-CM | POA: Diagnosis not present

## 2019-09-29 LAB — POCT PREGNANCY, URINE: Preg Test, Ur: NEGATIVE

## 2019-09-29 MED ORDER — LEVONORGESTREL 19.5 MCG/DAY IU IUD: INTRAUTERINE_SYSTEM | Freq: Once | INTRAUTERINE | Status: AC

## 2019-09-29 NOTE — Patient Instructions (Signed)
Levonorgestrel intrauterine device (IUD) What is this medicine? LEVONORGESTREL IUD (LEE voe nor jes trel) is a contraceptive (birth control) device. The device is placed inside the uterus by a healthcare professional. It is used to prevent pregnancy. This device can also be used to treat heavy bleeding that occurs during your period. This medicine may be used for other purposes; ask your health care provider or pharmacist if you have questions. COMMON BRAND NAME(S): Minette Headland What should I tell my health care provider before I take this medicine? They need to know if you have any of these conditions:  abnormal Pap smear  cancer of the breast, uterus, or cervix  diabetes  endometritis  genital or pelvic infection now or in the past  have more than one sexual partner or your partner has more than one partner  heart disease  history of an ectopic or tubal pregnancy  immune system problems  IUD in place  liver disease or tumor  problems with blood clots or take blood-thinners  seizures  use intravenous drugs  uterus of unusual shape  vaginal bleeding that has not been explained  an unusual or allergic reaction to levonorgestrel, other hormones, silicone, or polyethylene, medicines, foods, dyes, or preservatives  pregnant or trying to get pregnant  breast-feeding How should I use this medicine? This device is placed inside the uterus by a health care professional. Talk to your pediatrician regarding the use of this medicine in children. Special care may be needed. Overdosage: If you think you have taken too much of this medicine contact a poison control center or emergency room at once. NOTE: This medicine is only for you. Do not share this medicine with others. What if I miss a dose? This does not apply. Depending on the brand of device you have inserted, the device will need to be replaced every 3 to 6 years if you wish to continue using this type  of birth control. What may interact with this medicine? Do not take this medicine with any of the following medications:  amprenavir  bosentan  fosamprenavir This medicine may also interact with the following medications:  aprepitant  armodafinil  barbiturate medicines for inducing sleep or treating seizures  bexarotene  boceprevir  griseofulvin  medicines to treat seizures like carbamazepine, ethotoin, felbamate, oxcarbazepine, phenytoin, topiramate  modafinil  pioglitazone  rifabutin  rifampin  rifapentine  some medicines to treat HIV infection like atazanavir, efavirenz, indinavir, lopinavir, nelfinavir, tipranavir, ritonavir  St. John's wort  warfarin This list may not describe all possible interactions. Give your health care provider a list of all the medicines, herbs, non-prescription drugs, or dietary supplements you use. Also tell them if you smoke, drink alcohol, or use illegal drugs. Some items may interact with your medicine. What should I watch for while using this medicine? Visit your doctor or health care professional for regular check ups. See your doctor if you or your partner has sexual contact with others, becomes HIV positive, or gets a sexual transmitted disease. This product does not protect you against HIV infection (AIDS) or other sexually transmitted diseases. You can check the placement of the IUD yourself by reaching up to the top of your vagina with clean fingers to feel the threads. Do not pull on the threads. It is a good habit to check placement after each menstrual period. Call your doctor right away if you feel more of the IUD than just the threads or if you cannot feel the threads at  all. The IUD may come out by itself. You may become pregnant if the device comes out. If you notice that the IUD has come out use a backup birth control method like condoms and call your health care provider. Using tampons will not change the position of the  IUD and are okay to use during your period. This IUD can be safely scanned with magnetic resonance imaging (MRI) only under specific conditions. Before you have an MRI, tell your healthcare provider that you have an IUD in place, and which type of IUD you have in place. What side effects may I notice from receiving this medicine? Side effects that you should report to your doctor or health care professional as soon as possible:  allergic reactions like skin rash, itching or hives, swelling of the face, lips, or tongue  fever, flu-like symptoms  genital sores  high blood pressure  no menstrual period for 6 weeks during use  pain, swelling, warmth in the leg  pelvic pain or tenderness  severe or sudden headache  signs of pregnancy  stomach cramping  sudden shortness of breath  trouble with balance, talking, or walking  unusual vaginal bleeding, discharge  yellowing of the eyes or skin Side effects that usually do not require medical attention (report to your doctor or health care professional if they continue or are bothersome):  acne  breast pain  change in sex drive or performance  changes in weight  cramping, dizziness, or faintness while the device is being inserted  headache  irregular menstrual bleeding within first 3 to 6 months of use  nausea This list may not describe all possible side effects. Call your doctor for medical advice about side effects. You may report side effects to FDA at 1-800-FDA-1088. Where should I keep my medicine? This does not apply. NOTE: This sheet is a summary. It may not cover all possible information. If you have questions about this medicine, talk to your doctor, pharmacist, or health care provider.  2020 Elsevier/Gold Standard (2018-07-21 13:22:01) IUD PLACEMENT POST-PROCEDURE INSTRUCTIONS  1. You may take Ibuprofen, Aleve or Tylenol for pain if needed.  Cramping should resolve within in 24 hours.  2. You may have a small  amount of spotting.  You should wear a mini pad for the next few days.  3. You may have intercourse after 24 hours.  If you using this for birth control, it is effective immediately.  4. You need to call if you have any pelvic pain, fever, heavy bleeding or foul smelling vaginal discharge.  Irregular bleeding is common the first several months after having an IUD placed. You do not need to call for this reason unless you are concerned.  5. Shower or bathe as normal  6. You should have a follow-up appointment in 4-8 weeks for a re-check to make sure you are not having any problems. 

## 2019-09-29 NOTE — Addendum Note (Signed)
Addended by: Bethanne Ginger on: 09/29/2019 02:13 PM   Modules accepted: Orders

## 2019-09-29 NOTE — Progress Notes (Signed)
   GYNECOLOGY CLINIC PROCEDURE NOTE  [name] is a 44 y.o. [id number] here for [place] IUD insertion. No GYN concerns.  Last pap smear was on 12/2016 and was normal.  IUD Insertion Procedure Note Patient identified, informed consent performed.  Discussed risks of irregular bleeding, cramping, infection, malpositioning or misplacement of the IUD outside the uterus which may require further procedure such as laparoscopy. Time out was performed.  Urine pregnancy test negative.  Speculum placed in the vagina.  Cervix visualized.  Cleaned with Betadine x 2.  Grasped anteriorly with a single tooth tenaculum.  Uterus sounded to 6 cm. IUD placed per manufacturer's recommendations.  Strings trimmed to 3 cm. Tenaculum was removed, good hemostasis noted.  Patient tolerated procedure well.   Patient was given post-procedure instructions.  She was advised to be have backup contraception for one week.  Patient was also asked to check IUD strings periodically and follow up in 4 weeks for IUD check.  Luvenia Redden, PA-C 09/29/2019 1:40 PM

## 2019-10-05 ENCOUNTER — Other Ambulatory Visit: Payer: Self-pay

## 2019-10-05 ENCOUNTER — Telehealth: Payer: Self-pay | Admitting: Lactation Services

## 2019-10-05 ENCOUNTER — Ambulatory Visit (HOSPITAL_COMMUNITY)
Admission: RE | Admit: 2019-10-05 | Discharge: 2019-10-05 | Disposition: A | Discharge status: Home / Self Care | Payer: BC Managed Care – PPO | Source: Ambulatory Visit | Attending: Internal Medicine | Admitting: Internal Medicine

## 2019-10-05 ENCOUNTER — Other Ambulatory Visit: Payer: Self-pay | Admitting: *Deleted

## 2019-10-05 DIAGNOSIS — D509 Iron deficiency anemia, unspecified: Secondary | ICD-10-CM | POA: Diagnosis not present

## 2019-10-05 MED ORDER — SODIUM CHLORIDE 0.9 % IV SOLN
510.0000 mg | Freq: Once | INTRAVENOUS | Status: AC
  Administered 2019-10-05: 510 mg via INTRAVENOUS
  Filled 2019-10-05: qty 17

## 2019-10-05 MED ORDER — SODIUM CHLORIDE 0.9 % IV SOLN
INTRAVENOUS | Status: DC | PRN
  Administered 2019-10-05: 250 mL via INTRAVENOUS

## 2019-10-05 NOTE — Progress Notes (Signed)
PATIENT CARE CENTER NOTE  Diagnosis:  Iron deficiency anemia, unspecified iron deficiency anemia type (D50.9)     Provider:    [place] Cellar, MD   Procedure: IV Feraheme    Note: Patient received Feraheme infusion via PIV. Tolerated well with no adverse reaction. Observed patient for 30 minutes post-infusion. Vital signs stable. Discharge instructions given. Alert, oriented and ambulatory at discharge.

## 2019-10-05 NOTE — Telephone Encounter (Signed)
Pt called and left message on voicemail that she has been having iron infusions. She would like to get a refill on Poly Iron to help with the anemia suspected due to AUB. Will forward to Kerry Hough to get approval for medication requested.

## 2019-10-05 NOTE — Discharge Instructions (Signed)

## 2019-10-06 ENCOUNTER — Other Ambulatory Visit: Payer: Self-pay

## 2019-10-06 MED ORDER — POLYSACCHARIDE IRON COMPLEX 150 MG PO CAPS: 150.0000 mg | ORAL_CAPSULE | Freq: Every day | ORAL | 3 refills | Status: DC

## 2019-10-06 NOTE — Telephone Encounter (Signed)
Sent to pharmacy on file 

## 2019-10-06 NOTE — Progress Notes (Signed)
Patient request refill, Per Kerry Hough okay to refill.

## 2019-10-06 NOTE — Telephone Encounter (Signed)
Called pt to let he know iron prescription has been sent to her Pharmacy. Pt voiced understanding.

## 2019-10-06 NOTE — Telephone Encounter (Signed)
Refill is fine. Thanks  Luvenia Redden, PA-C 10/06/2019 8:34 AM

## 2019-10-12 NOTE — Telephone Encounter (Signed)
Called and spoke with the patient this morning.  She is declining to schedule an appointment with her PCP at this time.  Pt states, "She is having to see her GYN Dr right now and would rather wait and call back at a latter date to schedule an appointment."

## 2019-10-19 ENCOUNTER — Encounter: Payer: Self-pay | Admitting: General Practice

## 2019-12-06 ENCOUNTER — Ambulatory Visit (INDEPENDENT_AMBULATORY_CARE_PROVIDER_SITE_OTHER): Payer: BC Managed Care – PPO | Admitting: Family Medicine

## 2019-12-06 ENCOUNTER — Encounter: Payer: Self-pay | Admitting: Family Medicine

## 2019-12-06 ENCOUNTER — Other Ambulatory Visit: Payer: Self-pay

## 2019-12-06 VITALS — BP 143/90 | HR 61 | Wt 171.0 lb

## 2019-12-06 DIAGNOSIS — N920 Excessive and frequent menstruation with regular cycle: Secondary | ICD-10-CM

## 2019-12-06 DIAGNOSIS — N939 Abnormal uterine and vaginal bleeding, unspecified: Secondary | ICD-10-CM

## 2019-12-06 MED ORDER — MEGESTROL ACETATE 40 MG PO TABS: 40.0000 mg | ORAL_TABLET | Freq: Every day | ORAL | 0 refills | Status: DC

## 2019-12-06 NOTE — Assessment & Plan Note (Addendum)
On exam patient's IUD completely expulsed through cervical os with exception of one arm which had perforated the cervix and was holding IUD in place. IUD grasped with long kelly clamp, gently advanced into cervical canal to extricate perforated arm and then removed intact.  Patient very frustrated with course of menorrhagia over the past few months and would like to have surgical management. Has not been successful with IUD x2 at this point as well as hormonal treatments. Discused w Dr. Rip Harbour who will see patient in near future to discuss options.   Reviewed options for controlling bleeding in meantime, opts to continue with megace for time being, refill sent.

## 2019-12-06 NOTE — Progress Notes (Signed)
Still bleeding soaking pads

## 2019-12-06 NOTE — Progress Notes (Signed)
GYNECOLOGY OFFICE VISIT NOTE  History:   Megan Richardson is a 44 y.o. [phone] here today for follow up of menorrhagia.  Reports ongoing mehorrhagia that is very bothersome Seen 05/2019 for IUD and menorrhagia follow up, at that time IUD had expulsed spontaneously Took OCP's in September/October 2020 which was effective, but after had withdrawal bleed resumed OCPs but bleeding did not stop Subsequently took megace on and off which has lightened bleeding so that she's not soaking pads but still having bleeding Also had Mirena placed again 09/2019 but has not helped much Frustrated with hormonal treatments, wants to discuss surgical treatment Has also been getting regular iron transfusions over this time period for ongoing anemia.   Past Medical History:  Diagnosis Date  . Anemia   . Anxiety   . Asthma    With Exacerbations  . Constipation    SP normal colonoscopy 7/08, GI lebaur Dr. Elicia Lamp.  Marland Kitchen GERD (gastroesophageal reflux disease)   . Hepatitis B immune   . Palpitations    Chronic    Past Surgical History:  Procedure Laterality Date  . CESAREAN SECTION     Multiple  . COLONOSCOPY    . Tubal ligation  2006    The following portions of the patient's history were reviewed and updated as appropriate: allergies, current medications, past family history, past medical history, past social history, past surgical history and problem list.     Review of Systems:  Pertinent items noted in HPI and remainder of comprehensive ROS otherwise negative.  Physical Exam:  BP (!) 143/90   Pulse 61   Wt 171 lb (77.6 kg)   BMI 26.78 kg/m  CONSTITUTIONAL: Well-developed, well-nourished female in no acute distress.  HEENT:  Normocephalic, atraumatic. External right and left ear normal. No scleral icterus.  NECK: Normal range of motion, supple, no masses noted on observation SKIN: No rash noted. Not diaphoretic. No erythema. No pallor. MUSCULOSKELETAL: Normal range of motion. No edema  noted. NEUROLOGIC: Alert and oriented to person, place, and time. Normal muscle tone coordination. PSYCHIATRIC: Normal mood and affect. Normal behavior. Normal judgment and thought content. RESPIRATORY: Effort normal, no problems with respiration noted ABDOMEN: No masses noted. No other overt distention noted.   PELVIC: IUD body seen extruding from cervical os, one arm of IUD perforating through lateral cervical wall. Scan amount of blood in vaginal vault  Labs and Imaging No results found for this or any previous visit (from the past 168 hour(s)). No results found.    Assessment and Plan:   Problem List Items Addressed This Visit      Genitourinary   Abnormal uterine bleeding - Primary    On exam patient's IUD completely expulsed through cervical os with exception of one arm which had perforated the cervix and was holding IUD in place. IUD grasped with long kelly clamp, gently advanced into cervical canal to extricate perforated arm and then removed intact.  Patient very frustrated with course of menorrhagia over the past few months and would like to have surgical management. Has not been successful with IUD x2 at this point as well as hormonal treatments. Discused w Dr. Rip Harbour who will see patient in near future to discuss options.   Reviewed options for controlling bleeding in meantime, opts to continue with megace for time being, refill sent.       Relevant Medications   megestrol (MEGACE) 40 MG tablet      Routine preventative health maintenance measures emphasized. Please refer  to After Visit Summary for other counseling recommendations.   Return in about 4 weeks (around 01/03/2020), or to discuss ablation vs hysterectomy for AUB.    Total face-to-face time with patient: 20 minutes.  Over 50% of encounter was spent on counseling and coordination of care.   Augustin Coupe, [place] for [name], [place]

## 2019-12-21 ENCOUNTER — Encounter: Payer: BC Managed Care – PPO | Admitting: Internal Medicine

## 2020-01-12 ENCOUNTER — Encounter: Payer: Self-pay | Admitting: *Deleted

## 2020-01-13 ENCOUNTER — Encounter: Payer: Self-pay | Admitting: Obstetrics and Gynecology

## 2020-01-13 ENCOUNTER — Other Ambulatory Visit: Payer: Self-pay

## 2020-01-13 ENCOUNTER — Ambulatory Visit (INDEPENDENT_AMBULATORY_CARE_PROVIDER_SITE_OTHER): Payer: BC Managed Care – PPO | Admitting: Obstetrics and Gynecology

## 2020-01-13 VITALS — BP 128/80 | HR 69 | Ht 66.0 in | Wt 168.3 lb

## 2020-01-13 DIAGNOSIS — D219 Benign neoplasm of connective and other soft tissue, unspecified: Secondary | ICD-10-CM | POA: Diagnosis not present

## 2020-01-13 DIAGNOSIS — N939 Abnormal uterine and vaginal bleeding, unspecified: Secondary | ICD-10-CM | POA: Diagnosis not present

## 2020-01-13 NOTE — Progress Notes (Signed)
Megan Richardson presents for eval of her heavy cycles and uterine fibroids. See prior office notes. Cycles heavy to the point of anemia and both iron and blood transfusions. She has tried hormonal manipulation without help. She has also expelled 2 IUD's EMBX normal U/S 2 fibroids, one submucosal.  H/O c section x 2  PE AF VSS Lungs clear Heart RRR Abd soft + BS GU Nl EGBUS, menses noted, uterus < 10 weeks size mobile, tender, no adnexal masses   A/P Menorraghia        AUB        Uterine fibroids  Pt desires definite therapy. TVH/BS reviewed with pt . R/B/post op care reviewed. Pt desires to proceed. Will check CBC today. Information on TVH provided to pt. F/U with post op appt.

## 2020-01-13 NOTE — Patient Instructions (Signed)
Vaginal Hysterectomy, Care After Refer to this sheet in the next few weeks. These instructions provide you with information about caring for yourself after your procedure. Your health care provider may also give you more specific instructions. Your treatment has been planned according to current medical practices, but problems sometimes occur. Call your health care provider if you have any problems or questions after your procedure. What can I expect after the procedure? After the procedure, it is common to have:  Pain.  Soreness and numbness in your incision areas.  Vaginal bleeding and discharge.  Constipation.  Temporary problems emptying the bladder.  Feelings of sadness or other emotions. Follow these instructions at home: Medicines  Take over-the-counter and prescription medicines only as told by your health care provider.  If you were prescribed an antibiotic medicine, take it as told by your health care provider. Do not stop taking the antibiotic even if you start to feel better.  Do not drive or operate heavy machinery while taking prescription pain medicine. Activity  Return to your normal activities as told by your health care provider. Ask your health care provider what activities are safe for you.  Get regular exercise as told by your health care provider. You may be told to take short walks every day and go farther each time.  Do not lift anything that is heavier than 10 lb (4.5 kg). General instructions   Do not put anything in your vagina for 6 weeks after your surgery or as told by your health care provider. This includes tampons and douches.  Do not have sex until your health care provider says you can.  Do not take baths, swim, or use a hot tub until your health care provider approves.  Drink enough fluid to keep your urine clear or pale yellow.  Do not drive for 24 hours if you were given a sedative.  Keep all follow-up visits as told by your health  care provider. This is important. Contact a health care provider if:  Your pain medicine is not helping.  You have a fever.  You have redness, swelling, or pain at your incision site.  You have blood, pus, or a bad-smelling discharge from your vagina.  You continue to have difficulty urinating. Get help right away if:  You have severe abdominal or back pain.  You have heavy bleeding from your vagina.  You have chest pain or shortness of breath. This information is not intended to replace advice given to you by your health care provider. Make sure you discuss any questions you have with your health care provider. Document Revised: 05/02/2016 Document Reviewed: 09/24/2015 Elsevier Patient Education  2020 [place]. Vaginal Hysterectomy  A vaginal hysterectomy is a procedure to remove all or part of the uterus through a small incision in the vagina. In this procedure, your health care provider may remove your entire uterus, including the lower end (cervix). You may need a vaginal hysterectomy to treat:  Uterine fibroids.  A condition that causes the lining of the uterus to grow in other areas (endometriosis).  Problems with pelvic support.  Cancer of the cervix, ovaries, uterus, or tissue that lines the uterus (endometrium).  Excessive (dysfunctional) uterine bleeding. When removing your uterus, your health care provider may also remove the organs that produce eggs (ovaries) and the tubes that carry eggs to your uterus (fallopian tubes). After a vaginal hysterectomy, you will no longer be able to have a baby. You will also no longer get your  menstrual period. Tell a health care provider about:  Any allergies you have.  All medicines you are taking, including vitamins, herbs, eye drops, creams, and over-the-counter medicines.  Any problems you or family members have had with anesthetic medicines.  Any blood disorders you have.  Any surgeries you have had.  Any medical  conditions you have.  Whether you are pregnant or may be pregnant. What are the risks? Generally, this is a safe procedure. However, problems may occur, including:  Bleeding.  Infection.  A blood clot that forms in your leg and travels to your lungs (pulmonary embolism).  Damage to surrounding organs.  Pain during sex. What happens before the procedure?  Ask your health care provider what organs will be removed during surgery.  Ask your health care provider about: ? Changing or stopping your regular medicines. This is especially important if you are taking diabetes medicines or blood thinners. ? Taking medicines such as aspirin and ibuprofen. These medicines can thin your blood. Do not take these medicines before your procedure if your health care provider instructs you not to.  Follow instructions from your health care provider about eating or drinking restrictions.  Do not use any tobacco products, such as cigarettes, chewing tobacco, and e-cigarettes. If you need help quitting, ask your health care provider.  Plan to have someone take you home after discharge from the hospital. What happens during the procedure?  To reduce your risk of infection: ? Your health care team will wash or sanitize their hands. ? Your skin will be washed with soap.  An IV tube will be inserted into one of your veins.  You may be given antibiotic medicine to help prevent infection.  You will be given one or more of the following: ? A medicine to help you relax (sedative). ? A medicine to numb the area (local anesthetic). ? A medicine to make you fall asleep (general anesthetic). ? A medicine that is injected into an area of your body to numb everything beyond the injection site (regional anesthetic).  Your surgeon will make an incision in your vagina.  Your surgeon will locate and remove all or part of your uterus.  Your ovaries and fallopian tubes may be removed at the same time.  The  incision will be closed with stitches (sutures) that dissolve over time. The procedure may vary among health care providers and hospitals. What happens after the procedure?  Your blood pressure, heart rate, breathing rate, and blood oxygen level will be monitored often until the medicines you were given have worn off.  You will be encouraged to get up and walk around after a few hours to help prevent complications.  You may have IV tubes in place for a few days.  You will be given pain medicine as needed.  Do not drive for 24 hours if you were given a sedative. This information is not intended to replace advice given to you by your health care provider. Make sure you discuss any questions you have with your health care provider. Document Revised: 05/02/2016 Document Reviewed: 09/24/2015 Elsevier Patient Education  2020 [employer].

## 2020-01-14 LAB — CBC
Hematocrit: 35.7 % (ref 34.0–46.6)
Hemoglobin: 11.9 g/dL (ref 11.1–15.9)
MCH: 32.7 pg (ref 26.6–33.0)
MCHC: 33.3 g/dL (ref 31.5–35.7)
MCV: 98 fL — ABNORMAL HIGH (ref 79–97)
Platelets: 422 10*3/uL (ref 150–450)
RBC: 3.64 x10E6/uL — ABNORMAL LOW (ref 3.77–5.28)
RDW: 11.7 % (ref 11.7–15.4)
WBC: 4.9 10*3/uL (ref 3.4–10.8)

## 2020-02-25 NOTE — H&P (Signed)
Megan Richardson is an 44 y.o.G2P2 female with uterine menorrhagia and uterine fibroids, refactory to medical treatment. Desires definite therapy.  U/S uterine fibroids. EMBX negative  H/O c section x 2  Menstrual History: Menarche age: 26 No LMP recorded. (Menstrual status: Irregular Periods).    Past Medical History:  Diagnosis Date  . Anemia   . Anxiety   . Asthma    With Exacerbations  . Constipation    SP normal colonoscopy 7/08, GI le[facility] Dr. Elicia Lamp.  . Displacement of intrauterine contraceptive device 06/07/2019  . GERD (gastroesophageal reflux disease)   . Hepatitis B immune   . Palpitations    Chronic    Past Surgical History:  Procedure Laterality Date  . CESAREAN SECTION     Multiple  . COLONOSCOPY    . Tubal ligation  2006    Family History  Problem Relation Age of Onset  . Hypertension Mother   . Colon polyps Mother   . Diabetes Mother   . Heart disease Mother   . BRCA 1/2 Maternal Grandmother 80  . Throat cancer Maternal Aunt   . Lung cancer Maternal Grandfather   . Rectal cancer Brother   . Colon cancer Neg Hx   . Esophageal cancer Neg Hx     Social History:  reports that she quit smoking about 10 years ago. Her smoking use included cigarettes. She smoked 0.10 packs per day. She has never used smokeless tobacco. She reports current alcohol use. She reports that she does not use drugs.  Allergies: No Known Allergies  No medications prior to admission.    Review of Systems  Constitutional: Negative.   Respiratory: Negative.   Cardiovascular: Negative.   Gastrointestinal: Negative.   Genitourinary: Negative.     There were no vitals taken for this visit. Physical Exam  Constitutional: She appears well-developed and well-nourished.  Cardiovascular: Normal rate and regular rhythm.  Respiratory: Effort normal and breath sounds normal.  GI: Soft. Bowel sounds are normal.  Genitourinary:    Genitourinary Comments: Nl EGBUS, uterus < 10  weeks size, mobile, no adnexal masses     No results found for this or any previous visit (from the past 24 hour(s)).  No results found.  Assessment/Plan: Menorrhagia Uterine fibroids  TVH with possible salpingectomy reviewed with pt. R/B/Post op care reviewed with pt. Pt desires to proceed.   Chancy Milroy 02/25/2020, 7:13 PM

## 2020-02-28 NOTE — Patient Instructions (Addendum)
YOU ARE SCHEDULED FOR A COVID TEST   6-11-21_________@_2 :35pm___________. THIS TEST MUST BE DONE BEFORE SURGERY. GO TO  [address], [place], [address] AND REMAIN IN YOUR CAR, THIS IS A DRIVE UP TEST. ONCE YOUR COVID TEST IS DONE PLEASE FOLLOW ALL THE QUARANTINE  INSTRUCTIONS GIVEN IN YOUR HANDOUT.      Your procedure is scheduled on 03-07-20   Report to [facility] AT     1:00  P. M.   Call this number if you have problems the morning of surgery  [phone].   OUR ADDRESS IS [place].  WE ARE LOCATED IN [facility].                                     REMEMBER:  DO NOT EAT FOOD OR DRINK LIQUIDS AFTER MIDNIGHT .  YOU MAY  BRUSH YOUR TEETH MORNING OF SURGERY AND RINSE YOUR MOUTH OUT, NO CHEWING GUM CANDY OR MINTS.   TAKE THESE MEDICATIONS MORNING OF SURGERY WITH A SIP OF WATER:  ____inhaler and bring with you______________________________  IF YOU ARE SPENDING THE NIGHT AFTER SURGERY PLEASE BRING ALL YOUR PRESCRIPTION MEDICATIONS IN THEIR ORIGINAL BOTTLES. 1 VISITOR IS ALLOWED IN WAITING ROOM ONLY DAY OF SURGERY. NO VISITOR MAY SPEND THE NIGHT. VISITOR ARE ALLOWED TO STAY UNTIL 800 PM.                                    DO NOT WEAR JEWERLY, MAKE UP, OR NAIL POLISH ON FINGERNAILS. DO NOT WEAR LOTIONS, POWDERS, PERFUMES OR DEODORANT. DO NOT SHAVE FOR 24 HOURS PRIOR TO DAY OF SURGERY.  CONTACTS, GLASSES, OR DENTURES MAY NOT BE WORN TO SURGERY.                                    [facility] IS NOT RESPONSIBLE  FOR ANY BELONGINGS.                                                                    Marland Kitchen                                                                                                    [facility] - Preparing for Surgery Before surgery, you can play an important role.  Because skin is not sterile, your skin needs to be as free of germs as possible.  You can reduce the number of germs on your skin by washing with CHG  (chlorahexidine gluconate) soap before surgery.  CHG is an antiseptic cleaner which kills germs and bonds with  the skin to continue killing germs even after washing. Please DO NOT use if you have an allergy to CHG or antibacterial soaps.  If your skin becomes reddened/irritated stop using the CHG and inform your nurse when you arrive at Short Stay. Do not shave (including legs and underarms) for at least 48 hours prior to the first CHG shower.  You may shave your face/neck. Please follow these instructions carefully:  1.  Shower with CHG Soap the night before surgery and the  morning of Surgery.  2.  If you choose to wash your hair, wash your hair first as usual with your  normal  shampoo.  3.  After you shampoo, rinse your hair and body thoroughly to remove the  shampoo.                           4.  Use CHG as you would any other liquid soap.  You can apply chg directly  to the skin and wash                       Gently with a scrungie or clean washcloth.  5.  Apply the CHG Soap to your body ONLY FROM THE NECK DOWN.   Do not use on face/ open                           Wound or open sores. Avoid contact with eyes, ears mouth and genitals (private parts).                       Wash face,  Genitals (private parts) with your normal soap.             6.  Wash thoroughly, paying special attention to the area where your surgery  will be performed.  7.  Thoroughly rinse your body with warm water from the neck down.  8.  DO NOT shower/wash with your normal soap after using and rinsing off  the CHG Soap.                9.  Pat yourself dry with a clean towel.            10.  Wear clean pajamas.            11.  Place clean sheets on your bed the night of your first shower and do not  sleep with pets. Day of Surgery : Do not apply any lotions/deodorants the morning of surgery.  Please wear clean clothes to the hospital/surgery center.  FAILURE TO FOLLOW THESE INSTRUCTIONS MAY RESULT IN THE CANCELLATION OF  YOUR SURGERY PATIENT SIGNATURE_________________________________  NURSE SIGNATURE__________________________________  ________________________________________________________________________   Adam Phenix  An incentive spirometer is a tool that can help keep your lungs clear and active. This tool measures how well you are filling your lungs with each breath. Taking long deep breaths may help reverse or decrease the chance of developing breathing (pulmonary) problems (especially infection) following:  A long period of time when you are unable to move or be active. BEFORE THE PROCEDURE   If the spirometer includes an indicator to show your best effort, your nurse or respiratory therapist will set it to a desired goal.  If possible, sit up straight or lean slightly forward. Try not to slouch.  Hold the incentive spirometer in an upright position. INSTRUCTIONS FOR USE  1. Sit on the edge of your bed if possible, or sit up as far as you can in bed or on a chair. 2. Hold the incentive spirometer in an upright position. 3. Breathe out normally. 4. Place the mouthpiece in your mouth and seal your lips tightly around it. 5. Breathe in slowly and as deeply as possible, raising the piston or the ball toward the top of the column. 6. Hold your breath for 3-5 seconds or for as long as possible. Allow the piston or ball to fall to the bottom of the column. 7. Remove the mouthpiece from your mouth and breathe out normally. 8. Rest for a few seconds and repeat Steps 1 through 7 at least 10 times every 1-2 hours when you are awake. Take your time and take a few normal breaths between deep breaths. 9. The spirometer may include an indicator to show your best effort. Use the indicator as a goal to work toward during each repetition. 10. After each set of 10 deep breaths, practice coughing to be sure your lungs are clear. If you have an incision (the cut made at the time of surgery), support your  incision when coughing by placing a pillow or rolled up towels firmly against it. Once you are able to get out of bed, walk around indoors and cough well. You may stop using the incentive spirometer when instructed by your caregiver.  RISKS AND COMPLICATIONS  Take your time so you do not get dizzy or light-headed.  If you are in pain, you may need to take or ask for pain medication before doing incentive spirometry. It is harder to take a deep breath if you are having pain. AFTER USE  Rest and breathe slowly and easily.  It can be helpful to keep track of a log of your progress. Your caregiver can provide you with a simple table to help with this. If you are using the spirometer at home, follow these instructions: [place] IF:   You are having difficultly using the spirometer.  You have trouble using the spirometer as often as instructed.  Your pain medication is not giving enough relief while using the spirometer.  You develop fever of 100.5 F (38.1 C) or higher. SEEK IMMEDIATE MEDICAL CARE IF:   You cough up bloody sputum that had not been present before.  You develop fever of 102 F (38.9 C) or greater.  You develop worsening pain at or near the incision site. MAKE SURE YOU:   Understand these instructions.  Will watch your condition.  Will get help right away if you are not doing well or get worse. Document Released: 01/20/2007 Document Revised: 12/02/2011 Document Reviewed: 03/23/2007 [facility] Patient Information 2014 [place], [place].   ________________________________________________________________________

## 2020-02-28 NOTE — Progress Notes (Addendum)
PCP - Ladona Horns MD Cardiologist -   PPM/ICD -  Device Orders -  Rep Notified -   Chest x-ray -  EKG -  Stress Test -  ECHO -  Cardiac Cath -   Sleep Study -  CPAP -   Fasting Blood Sugar -  Checks Blood Sugar _____ times a day  Blood Thinner Instructions: Aspirin Instructions:  ERAS Protcol - PRE-SURGERY Ensure or G2-   COVID TEST- 6/11   Anesthesia review:   Patient denies shortness of breath, fever, cough and chest pain at PAT appointment  none   All instructions explained to the patient, with a verbal understanding of the material. Patient agrees to go over the instructions while at home for a better understanding. Patient also instructed to self quarantine after being tested for COVID-19. The opportunity to ask questions was provided.

## 2020-02-29 ENCOUNTER — Encounter (HOSPITAL_COMMUNITY): Payer: Self-pay

## 2020-02-29 ENCOUNTER — Encounter (HOSPITAL_COMMUNITY)
Admission: RE | Admit: 2020-02-29 | Discharge: 2020-02-29 | Disposition: A | Discharge status: Home / Self Care | Payer: BC Managed Care – PPO | Source: Ambulatory Visit | Attending: Obstetrics and Gynecology | Admitting: Obstetrics and Gynecology

## 2020-02-29 ENCOUNTER — Other Ambulatory Visit: Payer: Self-pay

## 2020-02-29 DIAGNOSIS — Z01812 Encounter for preprocedural laboratory examination: Secondary | ICD-10-CM | POA: Insufficient documentation

## 2020-02-29 LAB — CBC
HCT: 41.2 % (ref 36.0–46.0)
Hemoglobin: 13.3 g/dL (ref 12.0–15.0)
MCH: 32 pg (ref 26.0–34.0)
MCHC: 32.3 g/dL (ref 30.0–36.0)
MCV: 99.3 fL (ref 80.0–100.0)
Platelets: 353 10*3/uL (ref 150–400)
RBC: 4.15 MIL/uL (ref 3.87–5.11)
RDW: 12.2 % (ref 11.5–15.5)
WBC: 5.8 10*3/uL (ref 4.0–10.5)
nRBC: 0 % (ref 0.0–0.2)

## 2020-02-29 LAB — BASIC METABOLIC PANEL
Anion gap: 6 (ref 5–15)
BUN: 12 mg/dL (ref 6–20)
CO2: 24 mmol/L (ref 22–32)
Calcium: 9 mg/dL (ref 8.9–10.3)
Chloride: 108 mmol/L (ref 98–111)
Creatinine, Ser: 0.58 mg/dL (ref 0.44–1.00)
GFR calc Af Amer: >60 mL/min (ref >60)
GFR calc non Af Amer: >60 mL/min (ref >60)
Glucose, Bld: 90 mg/dL (ref 70–99)
Potassium: 4.4 mmol/L (ref 3.5–5.1)
Sodium: 138 mmol/L (ref 135–145)

## 2020-02-29 LAB — ABO/RH: ABO/RH(D): O POS

## 2020-03-03 ENCOUNTER — Other Ambulatory Visit (HOSPITAL_COMMUNITY)
Admission: RE | Admit: 2020-03-03 | Discharge: 2020-03-03 | Disposition: A | Discharge status: Home / Self Care | Payer: BC Managed Care – PPO | Source: Ambulatory Visit | Attending: Obstetrics and Gynecology | Admitting: Obstetrics and Gynecology

## 2020-03-03 ENCOUNTER — Other Ambulatory Visit (HOSPITAL_COMMUNITY): Payer: BC Managed Care – PPO

## 2020-03-03 DIAGNOSIS — Z01812 Encounter for preprocedural laboratory examination: Secondary | ICD-10-CM | POA: Diagnosis not present

## 2020-03-03 DIAGNOSIS — Z20822 Contact with and (suspected) exposure to covid-19: Secondary | ICD-10-CM | POA: Insufficient documentation

## 2020-03-03 LAB — SARS CORONAVIRUS 2 (TAT 6-24 HRS): SARS Coronavirus 2: NEGATIVE

## 2020-03-07 ENCOUNTER — Encounter (HOSPITAL_BASED_OUTPATIENT_CLINIC_OR_DEPARTMENT_OTHER): Payer: Self-pay | Admitting: Obstetrics and Gynecology

## 2020-03-07 ENCOUNTER — Observation Stay (HOSPITAL_BASED_OUTPATIENT_CLINIC_OR_DEPARTMENT_OTHER): Payer: BC Managed Care – PPO | Admitting: Certified Registered"

## 2020-03-07 ENCOUNTER — Encounter (HOSPITAL_BASED_OUTPATIENT_CLINIC_OR_DEPARTMENT_OTHER)
Admission: RE | Disposition: A | Discharge status: Home / Self Care | Payer: Self-pay | Source: Home / Self Care | Attending: Obstetrics and Gynecology

## 2020-03-07 ENCOUNTER — Observation Stay (HOSPITAL_BASED_OUTPATIENT_CLINIC_OR_DEPARTMENT_OTHER)
Admission: RE | Admit: 2020-03-07 | Discharge: 2020-03-08 | Disposition: A | Discharge status: Home / Self Care | Payer: BC Managed Care – PPO | Attending: Obstetrics and Gynecology | Admitting: Obstetrics and Gynecology

## 2020-03-07 ENCOUNTER — Other Ambulatory Visit: Payer: Self-pay

## 2020-03-07 DIAGNOSIS — K219 Gastro-esophageal reflux disease without esophagitis: Secondary | ICD-10-CM | POA: Diagnosis not present

## 2020-03-07 DIAGNOSIS — N920 Excessive and frequent menstruation with regular cycle: Secondary | ICD-10-CM | POA: Diagnosis not present

## 2020-03-07 DIAGNOSIS — N939 Abnormal uterine and vaginal bleeding, unspecified: Secondary | ICD-10-CM

## 2020-03-07 DIAGNOSIS — D259 Leiomyoma of uterus, unspecified: Secondary | ICD-10-CM

## 2020-03-07 DIAGNOSIS — Z8481 Family history of carrier of genetic disease: Secondary | ICD-10-CM | POA: Insufficient documentation

## 2020-03-07 DIAGNOSIS — Z801 Family history of malignant neoplasm of trachea, bronchus and lung: Secondary | ICD-10-CM | POA: Diagnosis not present

## 2020-03-07 DIAGNOSIS — Z9889 Other specified postprocedural states: Secondary | ICD-10-CM

## 2020-03-07 DIAGNOSIS — D251 Intramural leiomyoma of uterus: Secondary | ICD-10-CM | POA: Diagnosis not present

## 2020-03-07 DIAGNOSIS — Z808 Family history of malignant neoplasm of other organs or systems: Secondary | ICD-10-CM | POA: Diagnosis not present

## 2020-03-07 DIAGNOSIS — Z800 Family history of malignant neoplasm of digestive organs: Secondary | ICD-10-CM | POA: Insufficient documentation

## 2020-03-07 DIAGNOSIS — Z87891 Personal history of nicotine dependence: Secondary | ICD-10-CM | POA: Insufficient documentation

## 2020-03-07 DIAGNOSIS — J45909 Unspecified asthma, uncomplicated: Secondary | ICD-10-CM | POA: Diagnosis not present

## 2020-03-07 DIAGNOSIS — E785 Hyperlipidemia, unspecified: Secondary | ICD-10-CM | POA: Diagnosis not present

## 2020-03-07 HISTORY — PX: VAGINAL HYSTERECTOMY: SHX2639

## 2020-03-07 LAB — TYPE AND SCREEN
ABO/RH(D): O POS
Antibody Screen: NEGATIVE

## 2020-03-07 LAB — POCT PREGNANCY, URINE: Preg Test, Ur: NEGATIVE

## 2020-03-07 SURGERY — HYSTERECTOMY, VAGINAL
Anesthesia: General | Site: Vagina | Laterality: Bilateral

## 2020-03-07 MED ORDER — DEXAMETHASONE SODIUM PHOSPHATE 4 MG/ML IJ SOLN
INTRAMUSCULAR | Status: DC | PRN
  Administered 2020-03-07: 10 mg via INTRAVENOUS

## 2020-03-07 MED ORDER — SIMETHICONE 80 MG PO CHEW: 80.0000 mg | CHEWABLE_TABLET | Freq: Four times a day (QID) | ORAL | Status: DC | PRN

## 2020-03-07 MED ORDER — OXYCODONE-ACETAMINOPHEN 5-325 MG PO TABS: 1.0000 | ORAL_TABLET | ORAL | Status: DC | PRN

## 2020-03-07 MED ORDER — ROCURONIUM BROMIDE 100 MG/10ML IV SOLN
INTRAVENOUS | Status: DC | PRN
  Administered 2020-03-07: 10 mg via INTRAVENOUS
  Administered 2020-03-07: 50 mg via INTRAVENOUS

## 2020-03-07 MED ORDER — CHLORHEXIDINE GLUCONATE 0.12 % MT SOLN: 15.0000 mL | Freq: Once | OROMUCOSAL | Status: DC

## 2020-03-07 MED ORDER — KETOROLAC TROMETHAMINE 15 MG/ML IJ SOLN: 15.0000 mg | INTRAMUSCULAR | Status: DC

## 2020-03-07 MED ORDER — HYDROMORPHONE HCL 1 MG/ML IJ SOLN
INTRAMUSCULAR | Status: AC
  Filled 2020-03-07: qty 1

## 2020-03-07 MED ORDER — LIDOCAINE HCL (CARDIAC) PF 100 MG/5ML IV SOSY
PREFILLED_SYRINGE | INTRAVENOUS | Status: DC | PRN
  Administered 2020-03-07: 60 mg via INTRAVENOUS

## 2020-03-07 MED ORDER — FENTANYL CITRATE (PF) 100 MCG/2ML IJ SOLN
INTRAMUSCULAR | Status: AC
  Filled 2020-03-07: qty 2

## 2020-03-07 MED ORDER — IBUPROFEN 800 MG PO TABS: 800.0000 mg | ORAL_TABLET | Freq: Three times a day (TID) | ORAL | Status: DC

## 2020-03-07 MED ORDER — ACETAMINOPHEN 500 MG PO TABS
ORAL_TABLET | ORAL | Status: AC
  Filled 2020-03-07: qty 2

## 2020-03-07 MED ORDER — LIDOCAINE 2% (20 MG/ML) 5 ML SYRINGE
INTRAMUSCULAR | Status: AC
  Filled 2020-03-07: qty 5

## 2020-03-07 MED ORDER — SOD CITRATE-CITRIC ACID 500-334 MG/5ML PO SOLN: 30.0000 mL | ORAL | Status: DC

## 2020-03-07 MED ORDER — OXYCODONE-ACETAMINOPHEN 5-325 MG PO TABS
ORAL_TABLET | ORAL | Status: AC
  Filled 2020-03-07: qty 2

## 2020-03-07 MED ORDER — KETOROLAC TROMETHAMINE 30 MG/ML IJ SOLN
30.0000 mg | Freq: Four times a day (QID) | INTRAMUSCULAR | Status: DC
  Administered 2020-03-07 – 2020-03-08 (×2): 30 mg via INTRAVENOUS

## 2020-03-07 MED ORDER — FENTANYL CITRATE (PF) 100 MCG/2ML IJ SOLN
INTRAMUSCULAR | Status: DC | PRN
  Administered 2020-03-07 (×5): 50 ug via INTRAVENOUS
  Administered 2020-03-07 (×2): 25 ug via INTRAVENOUS

## 2020-03-07 MED ORDER — PROPOFOL 10 MG/ML IV BOLUS
INTRAVENOUS | Status: DC | PRN
  Administered 2020-03-07: 200 mg via INTRAVENOUS

## 2020-03-07 MED ORDER — HYDROMORPHONE HCL 1 MG/ML IJ SOLN
1.0000 mg | INTRAMUSCULAR | Status: DC | PRN
  Administered 2020-03-07: 1 mg via INTRAVENOUS

## 2020-03-07 MED ORDER — LACTATED RINGERS IV SOLN: INTRAVENOUS | Status: DC

## 2020-03-07 MED ORDER — ORAL CARE MOUTH RINSE: 15.0000 mL | Freq: Once | OROMUCOSAL | Status: DC

## 2020-03-07 MED ORDER — PROPOFOL 10 MG/ML IV BOLUS
INTRAVENOUS | Status: AC
  Filled 2020-03-07: qty 20

## 2020-03-07 MED ORDER — CEFAZOLIN SODIUM-DEXTROSE 2-4 GM/100ML-% IV SOLN
INTRAVENOUS | Status: AC
  Filled 2020-03-07: qty 100

## 2020-03-07 MED ORDER — ACETAMINOPHEN 500 MG PO TABS: 1000.0000 mg | ORAL_TABLET | ORAL | Status: DC

## 2020-03-07 MED ORDER — ONDANSETRON HCL 4 MG/2ML IJ SOLN
INTRAMUSCULAR | Status: DC | PRN
  Administered 2020-03-07: 4 mg via INTRAVENOUS

## 2020-03-07 MED ORDER — ONDANSETRON HCL 4 MG PO TABS: 4.0000 mg | ORAL_TABLET | Freq: Four times a day (QID) | ORAL | Status: DC | PRN

## 2020-03-07 MED ORDER — ROCURONIUM BROMIDE 10 MG/ML (PF) SYRINGE
PREFILLED_SYRINGE | INTRAVENOUS | Status: AC
  Filled 2020-03-07: qty 10

## 2020-03-07 MED ORDER — CEFAZOLIN SODIUM-DEXTROSE 2-4 GM/100ML-% IV SOLN
2.0000 g | INTRAVENOUS | Status: AC
  Administered 2020-03-07: 2 g via INTRAVENOUS

## 2020-03-07 MED ORDER — SUGAMMADEX SODIUM 200 MG/2ML IV SOLN
INTRAVENOUS | Status: DC | PRN
Start: 2020-03-07 — End: 2020-03-07
  Administered 2020-03-07: 200 mg via INTRAVENOUS

## 2020-03-07 MED ORDER — POLYETHYLENE GLYCOL 3350 17 G PO PACK
17.0000 g | PACK | Freq: Every day | ORAL | Status: DC
  Administered 2020-03-07: 17 g via ORAL

## 2020-03-07 MED ORDER — ZOLPIDEM TARTRATE 5 MG PO TABS
5.0000 mg | ORAL_TABLET | Freq: Every evening | ORAL | Status: DC | PRN
  Administered 2020-03-08: 5 mg via ORAL

## 2020-03-07 MED ORDER — HYDROMORPHONE HCL 1 MG/ML IJ SOLN
0.2500 mg | INTRAMUSCULAR | Status: DC | PRN
  Administered 2020-03-07 (×4): 0.5 mg via INTRAVENOUS

## 2020-03-07 MED ORDER — SCOPOLAMINE 1 MG/3DAYS TD PT72
MEDICATED_PATCH | TRANSDERMAL | Status: AC
  Filled 2020-03-07: qty 1

## 2020-03-07 MED ORDER — MIDAZOLAM HCL 5 MG/5ML IJ SOLN
INTRAMUSCULAR | Status: DC | PRN
  Administered 2020-03-07: 2 mg via INTRAVENOUS

## 2020-03-07 MED ORDER — POLYETHYLENE GLYCOL 3350 17 G PO PACK
PACK | ORAL | Status: AC
  Filled 2020-03-07: qty 1

## 2020-03-07 MED ORDER — KETOROLAC TROMETHAMINE 30 MG/ML IJ SOLN
INTRAMUSCULAR | Status: AC
  Filled 2020-03-07: qty 1

## 2020-03-07 MED ORDER — KETOROLAC TROMETHAMINE 30 MG/ML IJ SOLN
INTRAMUSCULAR | Status: DC | PRN
  Administered 2020-03-07: 30 mg via INTRAVENOUS

## 2020-03-07 MED ORDER — OXYCODONE-ACETAMINOPHEN 5-325 MG PO TABS
2.0000 | ORAL_TABLET | ORAL | Status: DC | PRN
  Administered 2020-03-07 – 2020-03-08 (×4): 2 via ORAL

## 2020-03-07 MED ORDER — ONDANSETRON HCL 4 MG/2ML IJ SOLN: 4.0000 mg | Freq: Four times a day (QID) | INTRAMUSCULAR | Status: DC | PRN

## 2020-03-07 MED ORDER — MIDAZOLAM HCL 2 MG/2ML IJ SOLN
INTRAMUSCULAR | Status: AC
  Filled 2020-03-07: qty 2

## 2020-03-07 MED ORDER — SCOPOLAMINE 1 MG/3DAYS TD PT72
1.0000 | MEDICATED_PATCH | TRANSDERMAL | Status: DC
  Administered 2020-03-07: 1.5 mg via TRANSDERMAL

## 2020-03-07 SURGICAL SUPPLY — 31 items
BLADE SURG 10 STRL SS (BLADE) ×2 IMPLANT
BRIEF STRETCH FOR OB PAD XXL (UNDERPADS AND DIAPERS) ×2 IMPLANT
CANISTER SUCT 1200ML W/VALVE (MISCELLANEOUS) ×2 IMPLANT
CLEANER CAUTERY TIP 5X5 PAD (MISCELLANEOUS) IMPLANT
CNTNR URN SCR LID CUP LEK RST (MISCELLANEOUS) IMPLANT
CONT SPEC 4OZ STRL OR WHT (MISCELLANEOUS)
DECANTER SPIKE VIAL GLASS SM (MISCELLANEOUS) IMPLANT
GAUZE 4X4 16PLY RFD (DISPOSABLE) ×2 IMPLANT
GLOVE BIO SURGEON STRL SZ7.5 (GLOVE) ×2 IMPLANT
GLOVE BIO SURGEON STRL SZ8 (GLOVE) ×2 IMPLANT
GLOVE BIOGEL PI IND STRL 6.5 (GLOVE) ×1 IMPLANT
GLOVE BIOGEL PI IND STRL 7.0 (GLOVE) ×2 IMPLANT
GLOVE BIOGEL PI INDICATOR 6.5 (GLOVE) ×1
GLOVE BIOGEL PI INDICATOR 7.0 (GLOVE) ×2
GOWN STRL REUS W/TWL LRG LVL3 (GOWN DISPOSABLE) ×6 IMPLANT
GOWN STRL REUS W/TWL XL LVL3 (GOWN DISPOSABLE) ×2 IMPLANT
NS IRRIG 1000ML POUR BTL (IV SOLUTION) ×2 IMPLANT
PACK VAGINAL WOMENS (CUSTOM PROCEDURE TRAY) ×2 IMPLANT
PAD CLEANER CAUTERY TIP 5X5 (MISCELLANEOUS) ×1
PAD OB MATERNITY 4.3X12.25 (PERSONAL CARE ITEMS) ×2 IMPLANT
PAD PREP 24X48 CUFFED NSTRL (MISCELLANEOUS) ×2 IMPLANT
PENCIL BUTTON HOLSTER BLD 10FT (ELECTRODE) ×1 IMPLANT
SLEEVE SCD COMPRESS KNEE MED (MISCELLANEOUS) ×2 IMPLANT
SUT VIC AB 2-0 CT1 18 (SUTURE) ×2 IMPLANT
SUT VIC AB 2-0 CT1 27 (SUTURE)
SUT VIC AB 2-0 CT1 TAPERPNT 27 (SUTURE) IMPLANT
SUT VIC AB PLUS 45CM 1-MO-4 (SUTURE) ×6 IMPLANT
SUT VICRYL 0 ENDOLOOP (SUTURE) IMPLANT
SUT VICRYL 1 TIES 12X18 (SUTURE) ×2 IMPLANT
TOWEL OR 17X26 10 PK STRL BLUE (TOWEL DISPOSABLE) ×4 IMPLANT
TRAY FOLEY W/BAG SLVR 14FR LF (SET/KITS/TRAYS/PACK) ×2 IMPLANT

## 2020-03-07 NOTE — Anesthesia Preprocedure Evaluation (Signed)
Anesthesia Evaluation  Patient identified by MRN, date of birth, ID band Patient awake    Reviewed: Allergy & Precautions, NPO status , Patient's Chart, lab work & pertinent test results  History of Anesthesia Complications Negative for: history of anesthetic complications  Airway Mallampati: II  TM Distance: >3 FB Neck ROM: Full    Dental no notable dental hx. (+) Caps,    Pulmonary asthma , former smoker,    Pulmonary exam normal        Cardiovascular negative cardio ROS Normal cardiovascular exam     Neuro/Psych Anxiety negative neurological ROS     GI/Hepatic Neg liver ROS, GERD  Controlled,  Endo/Other  negative endocrine ROS  Renal/GU negative Renal ROS  negative genitourinary   Musculoskeletal negative musculoskeletal ROS (+)   Abdominal   Peds  Hematology negative hematology ROS (+)   Anesthesia Other Findings Day of surgery medications reviewed with patient.  Reproductive/Obstetrics Uterine fibroids                            Anesthesia Physical Anesthesia Plan  ASA: II  Anesthesia Plan: General   Post-op Pain Management:    Induction: Intravenous  PONV Risk Score and Plan: 4 or greater and Treatment may vary due to age or medical condition, Ondansetron, Dexamethasone, Midazolam and Scopolamine patch - Pre-op  Airway Management Planned: Oral ETT  Additional Equipment: None  Intra-op Plan:   Post-operative Plan: Extubation in OR  Informed Consent: I have reviewed the patients History and Physical, chart, labs and discussed the procedure including the risks, benefits and alternatives for the proposed anesthesia with the patient or authorized representative who has indicated his/her understanding and acceptance.     Dental advisory given  Plan Discussed with: CRNA  Anesthesia Plan Comments:         Anesthesia Quick Evaluation

## 2020-03-07 NOTE — Anesthesia Procedure Notes (Signed)
Procedure Name: Intubation Date/Time: 03/07/2020 3:12 PM Performed by: Justice Rocher, CRNA Pre-anesthesia Checklist: Patient identified, Emergency Drugs available, Suction available, Patient being monitored and Timeout performed Patient Re-evaluated:Patient Re-evaluated prior to induction Oxygen Delivery Method: Circle system utilized Preoxygenation: Pre-oxygenation with 100% oxygen Induction Type: IV induction Ventilation: Mask ventilation without difficulty Laryngoscope Size: Mac and 3 Grade View: Grade II Tube type: Oral Tube size: 7.0 mm Number of attempts: 1 Airway Equipment and Method: Stylet and Oral airway Placement Confirmation: ETT inserted through vocal cords under direct vision,  positive ETCO2,  breath sounds checked- equal and bilateral and CO2 detector Secured at: 23 cm Tube secured with: Tape Dental Injury: Teeth and Oropharynx as per pre-operative assessment

## 2020-03-07 NOTE — Transfer of Care (Signed)
Immediate Anesthesia Transfer of Care Note  Patient: Megan Richardson  Procedure(s) Performed: Procedure(s) (LRB): HYSTERECTOMY VAGINAL WITH SALPINGECTOMY (Bilateral)  Patient Location: PACU  Anesthesia Type: General  Level of Consciousness: awake, sedated, patient cooperative and responds to stimulation  Airway & Oxygen Therapy: Patient Spontanous Breathing and Patient connected to  02 and soft FM   Post-op Assessment: Report given to PACU RN, Post -op Vital signs reviewed and stable and Patient moving all extremities  Post vital signs: Reviewed and stable  Complications: No apparent anesthesia complications

## 2020-03-07 NOTE — Interval H&P Note (Signed)
History and Physical Interval Note:  03/07/2020 2:47 PM  Megan Richardson  has presented today for surgery, with the diagnosis of Heavy Cycles Uterine Fibroids.  The various methods of treatment have been discussed with the patient and family. After consideration of risks, benefits and other options for treatment, the patient has consented to  Procedure(s): HYSTERECTOMY VAGINAL WITH SALPINGECTOMY (Bilateral) as a surgical intervention.  The patient's history has been reviewed, patient examined, no change in status, stable for surgery.  I have reviewed the patient's chart and labs.  Questions were answered to the patient's satisfaction.     Chancy Milroy

## 2020-03-07 NOTE — Op Note (Signed)
Megan Richardson PROCEDURE DATE: 03/07/2020  PREOPERATIVE DIAGNOSIS:  Symptomatic fibroids, menorrhagia POSTOPERATIVE DIAGNOSIS:  Symptomatic fibroids, menorrhagia SURGEON:  Arlina Robes, M.D. ASSISTANT: Aletha Halim, M.D.  An experienced assistant was required given the standard of surgical care given the complexity of the case.  This assistant was needed for exposure, dissection, suctioning, retraction, instrument exchange,  and for overall help during the procedure.  OPERATION:  Total Vaginal hysterectomy with morcellation and bilateral salpingectomy ANESTHESIA:  General endotracheal.  INDICATIONS: The patient is a 44 y.o. [id number] with history of symptomatic uterine fibroids/menorrhagia. The patient made a decision to undergo definite surgical treatment. On the preoperative visit, the risks, benefits, indications, and alternatives of the procedure were reviewed with the patient.  On the day of surgery, the risks of surgery were again discussed with the patient including but not limited to: bleeding which may require transfusion or reoperation; infection which may require antibiotics; injury to bowel, bladder, ureters or other surrounding organs; need for additional procedures; thromboembolic phenomenon, incisional problems and other postoperative/anesthesia complications. Written informed consent was obtained.    OPERATIVE FINDINGS: A 10 week size uterus with normal tubes and ovaries bilaterally.  ESTIMATED BLOOD LOSS: 125 ml FLUIDS:  As recorded URINE OUTPUT:  As recorded SPECIMENS:  Uterus ,cervix and tubes sent to pathology COMPLICATIONS:  None immediate.  DESCRIPTION OF PROCEDURE:  The patient received intravenous antibiotics and had sequential compression devices applied to her lower extremities while in the preoperative area.  She was then taken to the operating room where general anesthesia was administered and was found to be adequate.  She was placed in the dorsal lithotomy  position, and was prepped and draped in a sterile manner.  A Foley catheter was inserted into her bladder and attached to constant drainage. After an adequate timeout was performed, attention was turned to her pelvis.  A weighted speculum was then placed in the vagina, and the posterior lip of the cervix was grasped  with tenaculum. The posterior cul de sac was entered sharply A long weighted speculum was placed. The anterior lip of the cervix was grasped and sharply circumscribed.  The bladder was dissected off the pubocervical fascia anteriorly without complication.  The anterior cul-de-sac was then entered sharply without difficulty and a retractor was placed. Zeplin clamps were then used to clamp the uterosacral ligaments on each side.  They were then cut and sutured ligated with 0 Vicryl.  Of note, all sutures used in this case were 0 Vicryl unless otherwise noted.   The cardinal ligaments were then clamped, cut and ligated. The uterine vessels and broad ligaments were then serially clamped with the Zeplin clamps, cut, and suture ligated on both sides. To allow more visualization the uterus was morcellated in a bivalve method. The final pedicle bilaterally involving the IP was clamped, cut and ligated with a free tie and in a Davidson fashion.  Excellent hemostasis was noted at this point. The uterus was delivered and sent to pathology. Each tube was identified, grasped, clamped, cut and tied with a free tie.  After completion of the hysterectomy, all pedicles from the uterosacral ligament to the cornua were examined hemostasis was confirmed.  The vaginal cuff was then closed with a 2/0 Vicryl in a figure eight fashion.  All instruments were then removed from the pelvis.  The patient tolerated the procedure well.  All instruments, needles, and sponge counts were correct x 2. The patient was taken to the recovery room in stable condition.  Arlina Robes, MD, [place] [facility]ending [place], [facility]

## 2020-03-08 ENCOUNTER — Encounter (HOSPITAL_BASED_OUTPATIENT_CLINIC_OR_DEPARTMENT_OTHER): Payer: Self-pay | Admitting: Obstetrics and Gynecology

## 2020-03-08 DIAGNOSIS — Z800 Family history of malignant neoplasm of digestive organs: Secondary | ICD-10-CM | POA: Diagnosis not present

## 2020-03-08 DIAGNOSIS — Z808 Family history of malignant neoplasm of other organs or systems: Secondary | ICD-10-CM | POA: Diagnosis not present

## 2020-03-08 DIAGNOSIS — K219 Gastro-esophageal reflux disease without esophagitis: Secondary | ICD-10-CM | POA: Diagnosis not present

## 2020-03-08 DIAGNOSIS — D251 Intramural leiomyoma of uterus: Secondary | ICD-10-CM | POA: Diagnosis not present

## 2020-03-08 DIAGNOSIS — J45909 Unspecified asthma, uncomplicated: Secondary | ICD-10-CM | POA: Diagnosis not present

## 2020-03-08 DIAGNOSIS — Z87891 Personal history of nicotine dependence: Secondary | ICD-10-CM | POA: Diagnosis not present

## 2020-03-08 DIAGNOSIS — D259 Leiomyoma of uterus, unspecified: Secondary | ICD-10-CM | POA: Diagnosis not present

## 2020-03-08 DIAGNOSIS — Z801 Family history of malignant neoplasm of trachea, bronchus and lung: Secondary | ICD-10-CM | POA: Diagnosis not present

## 2020-03-08 DIAGNOSIS — N920 Excessive and frequent menstruation with regular cycle: Secondary | ICD-10-CM | POA: Diagnosis not present

## 2020-03-08 DIAGNOSIS — Z8481 Family history of carrier of genetic disease: Secondary | ICD-10-CM | POA: Diagnosis not present

## 2020-03-08 LAB — CBC
HCT: 32.7 % — ABNORMAL LOW (ref 36.0–46.0)
Hemoglobin: 10.7 g/dL — ABNORMAL LOW (ref 12.0–15.0)
MCH: 32.3 pg (ref 26.0–34.0)
MCHC: 32.7 g/dL (ref 30.0–36.0)
MCV: 98.8 fL (ref 80.0–100.0)
Platelets: 301 10*3/uL (ref 150–400)
RBC: 3.31 MIL/uL — ABNORMAL LOW (ref 3.87–5.11)
RDW: 11.9 % (ref 11.5–15.5)
WBC: 6.7 10*3/uL (ref 4.0–10.5)
nRBC: 0 % (ref 0.0–0.2)

## 2020-03-08 LAB — BASIC METABOLIC PANEL
Anion gap: 6 (ref 5–15)
BUN: 7 mg/dL (ref 6–20)
CO2: 23 mmol/L (ref 22–32)
Calcium: 8.5 mg/dL — ABNORMAL LOW (ref 8.9–10.3)
Chloride: 108 mmol/L (ref 98–111)
Creatinine, Ser: 0.54 mg/dL (ref 0.44–1.00)
GFR calc Af Amer: >60 mL/min (ref >60)
GFR calc non Af Amer: >60 mL/min (ref >60)
Glucose, Bld: 108 mg/dL — ABNORMAL HIGH (ref 70–99)
Potassium: 4 mmol/L (ref 3.5–5.1)
Sodium: 137 mmol/L (ref 135–145)

## 2020-03-08 MED ORDER — OXYCODONE-ACETAMINOPHEN 5-325 MG PO TABS
ORAL_TABLET | ORAL | Status: AC
  Filled 2020-03-08: qty 2

## 2020-03-08 MED ORDER — ZOLPIDEM TARTRATE 5 MG PO TABS
ORAL_TABLET | ORAL | Status: AC
  Filled 2020-03-08: qty 1

## 2020-03-08 MED ORDER — KETOROLAC TROMETHAMINE 30 MG/ML IJ SOLN
INTRAMUSCULAR | Status: AC
  Filled 2020-03-08: qty 1

## 2020-03-08 MED ORDER — IBUPROFEN 800 MG PO TABS: 800.0000 mg | ORAL_TABLET | Freq: Three times a day (TID) | ORAL | 0 refills | Status: DC

## 2020-03-08 MED ORDER — OXYCODONE-ACETAMINOPHEN 5-325 MG PO TABS: 1.0000 | ORAL_TABLET | ORAL | 0 refills | Status: DC | PRN

## 2020-03-08 NOTE — Discharge Instructions (Signed)
Vaginal Hysterectomy, Care After Refer to this sheet in the next few weeks. These instructions provide you with information about caring for yourself after your procedure. Your health care provider may also give you more specific instructions. Your treatment has been planned according to current medical practices, but problems sometimes occur. Call your health care provider if you have any problems or questions after your procedure. What can I expect after the procedure? After the procedure, it is common to have:  Pain.  Soreness and numbness in your incision areas.  Vaginal bleeding and discharge.  Constipation.  Temporary problems emptying the bladder.  Feelings of sadness or other emotions. Follow these instructions at home: Medicines  Take over-the-counter and prescription medicines only as told by your health care provider.  If you were prescribed an antibiotic medicine, take it as told by your health care provider. Do not stop taking the antibiotic even if you start to feel better.  Do not drive or operate heavy machinery while taking prescription pain medicine. Activity  Return to your normal activities as told by your health care provider. Ask your health care provider what activities are safe for you.  Get regular exercise as told by your health care provider. You may be told to take short walks every day and go farther each time.  Do not lift anything that is heavier than 10 lb (4.5 kg). General instructions   Do not put anything in your vagina for 6 weeks after your surgery or as told by your health care provider. This includes tampons and douches.  Do not have sex until your health care provider says you can.  Do not take baths, swim, or use a hot tub until your health care provider approves.  Drink enough fluid to keep your urine clear or pale yellow.  Do not drive for 24 hours if you were given a sedative.  Keep all follow-up visits as told by your health  care provider. This is important. Contact a health care provider if:  Your pain medicine is not helping.  You have a fever.  You have redness, swelling, or pain at your incision site.  You have blood, pus, or a bad-smelling discharge from your vagina.  You continue to have difficulty urinating. Get help right away if:  You have severe abdominal or back pain.  You have heavy bleeding from your vagina.  You have chest pain or shortness of breath. This information is not intended to replace advice given to you by your health care provider. Make sure you discuss any questions you have with your health care provider. Document Revised: 05/02/2016 Document Reviewed: 09/24/2015 Elsevier Patient Education  2020 [employer]  

## 2020-03-08 NOTE — Anesthesia Postprocedure Evaluation (Signed)
Anesthesia Post Note  Patient: Megan Richardson  Procedure(s) Performed: HYSTERECTOMY VAGINAL WITH SALPINGECTOMY (Bilateral Vagina )     Anesthesia Post Evaluation No complications documented.  Last Vitals:  Vitals:   03/08/20 0546 03/08/20 0837  BP: 118/78 108/75  Pulse: (!) 57 60  Resp: 14 16  Temp: 37.1 C 37.1 C  SpO2: 100% 99%    Last Pain:  Vitals:   03/08/20 0837  TempSrc:   PainSc: 6                  Willine Schwalbe

## 2020-03-08 NOTE — Discharge Summary (Signed)
Physician Discharge Summary  Patient ID: Megan Richardson MRN: [id number] DOB/AGE: 1976/04/29 44 y.o.  Admit date: 03/07/2020 Discharge date: 03/08/2020  Admission Diagnoses: Menorrhagia and uterine fibroids  Discharge Diagnoses:  Active Problems:   Post-operative state   Discharged Condition: good  Hospital Course: Megan Richardson was admitted with above Dx. She underwent a TVH/BS without problems. See OP note for additional information.  She progressed to ambulating, voiding, tolerating diet and good oral pain control. Felt amendable for discharge home. Discharge instructions, medications and follow up reviewed with pt. She verbalized understanding.  Consults: None  Significant Diagnostic Studies: labs  Treatments: surgery: TVH/BS  Discharge Exam: Blood pressure 118/78, pulse (!) 57, temperature 98.7 F (37.1 C), resp. rate 14, height 5\' 7"  (1.702 m), weight 74.3 kg, last menstrual period 02/19/2020, SpO2 100 %.  Lungs clear Heart RRR Abd soft + BS GU no bleeding Ext non tender  Disposition: Discharge disposition: 01-Home or Self Care       Discharge Instructions    Call MD for:  difficulty breathing, headache or visual disturbances   Complete by: As directed    Call MD for:  extreme fatigue   Complete by: As directed    Call MD for:  hives   Complete by: As directed    Call MD for:  persistant dizziness or light-headedness   Complete by: As directed    Call MD for:  persistant nausea and vomiting   Complete by: As directed    Call MD for:  redness, tenderness, or signs of infection (pain, swelling, redness, odor or green/yellow discharge around incision site)   Complete by: As directed    Call MD for:  severe uncontrolled pain   Complete by: As directed    Call MD for:  temperature >100.4   Complete by: As directed    Diet - low sodium heart healthy   Complete by: As directed    Increase activity slowly   Complete by: As directed    Sexual Activity Restrictions    Complete by: As directed    Pelvic rest x 4 weeks     Allergies as of 03/08/2020   No Known Allergies     Medication List    TAKE these medications   albuterol 108 (90 Base) MCG/ACT inhaler Commonly known as: Ventolin HFA Inhale 2 puffs into the lungs every 6 (six) hours as needed for wheezing or shortness of breath.   ibuprofen 800 MG tablet Commonly known as: ADVIL Take 1 tablet (800 mg total) by mouth 3 (three) times daily.   iron polysaccharides 150 MG capsule Commonly known as: Nu-Iron Take 1 capsule (150 mg total) by mouth daily.   oxyCODONE-acetaminophen 5-325 MG tablet Commonly known as: PERCOCET/ROXICET Take 1 tablet by mouth every 4 (four) hours as needed for moderate pain.       [facility]-up [facility]. Schedule an appointment as soon as possible for a visit in 4 week(s).   Specialty: Obstetrics and Gynecology Why: Post op appt with Dr. Gardiner Fanti Contact information: [place] [phone]              Signed: Chancy Milroy 03/08/2020, 8:32 AM

## 2020-03-10 LAB — SURGICAL PATHOLOGY

## 2020-03-15 ENCOUNTER — Other Ambulatory Visit: Payer: Self-pay | Admitting: Obstetrics and Gynecology

## 2020-03-15 ENCOUNTER — Encounter: Payer: Self-pay | Admitting: Obstetrics and Gynecology

## 2020-03-24 ENCOUNTER — Other Ambulatory Visit: Payer: Self-pay | Admitting: Obstetrics and Gynecology

## 2020-04-12 ENCOUNTER — Encounter: Payer: Self-pay | Admitting: Obstetrics and Gynecology

## 2020-04-12 ENCOUNTER — Ambulatory Visit (INDEPENDENT_AMBULATORY_CARE_PROVIDER_SITE_OTHER): Payer: BC Managed Care – PPO | Admitting: Obstetrics and Gynecology

## 2020-04-12 ENCOUNTER — Other Ambulatory Visit: Payer: Self-pay

## 2020-04-12 VITALS — BP 125/85 | HR 73 | Wt 168.4 lb

## 2020-04-12 DIAGNOSIS — Z9889 Other specified postprocedural states: Secondary | ICD-10-CM

## 2020-04-12 NOTE — Progress Notes (Signed)
GYNECOLOGY OFFICE FOLLOW UP NOTE  History:  44 y.o. [id number] here today for follow up for [facility], BS on 03/07/20 for symptomatic fibroids and menorrhagia.  She is doing well, has no pain. No issues with eating, voiding, urinating. Is back at work.   Past Medical History:  Diagnosis Date  . Anemia   . Anxiety   . Asthma    With Exacerbations  . Constipation    SP normal colonoscopy 7/08, GI lebaur Dr. Elicia Lamp.  . Displacement of intrauterine contraceptive device 06/07/2019  . GERD (gastroesophageal reflux disease)    pt. denies  . Hepatitis B immune   . Palpitations    Chronic    Past Surgical History:  Procedure Laterality Date  . CESAREAN SECTION     x2  . COLONOSCOPY    . Tubal ligation  2006  . VAGINAL HYSTERECTOMY Bilateral 03/07/2020   Procedure: HYSTERECTOMY VAGINAL WITH SALPINGECTOMY;  Surgeon: Chancy Milroy, MD;  Location: [facility];  Service: Gynecology;  Laterality: Bilateral;     Current Outpatient Medications:  .  iron polysaccharides (NU-IRON) 150 MG capsule, Take 1 capsule (150 mg total) by mouth daily., Disp: 90 capsule, Rfl: 3 .  albuterol (VENTOLIN HFA) 108 (90 Base) MCG/ACT inhaler, Inhale 2 puffs into the lungs every 6 (six) hours as needed for wheezing or shortness of breath. (Patient not taking: Reported on 04/12/2020), Disp: 3 Inhaler, Rfl: 3 .  ibuprofen (ADVIL) 800 MG tablet, TAKE 1 TABLET(800 MG) BY MOUTH THREE TIMES DAILY (Patient not taking: Reported on 04/12/2020), Disp: 30 tablet, Rfl: 5 .  oxyCODONE-acetaminophen (PERCOCET/ROXICET) 5-325 MG tablet, Take 1 tablet by mouth every 4 (four) hours as needed for moderate pain. (Patient not taking: Reported on 04/12/2020), Disp: 30 tablet, Rfl: 0  The following portions of the patient's history were reviewed and updated as appropriate: allergies, current medications, past family history, past medical history, past social history, past surgical history and problem list.   Review of  Systems:  Pertinent items noted in HPI and remainder of comprehensive ROS otherwise negative.   Objective:  Physical Exam BP 125/85   Pulse 73   Wt 168 lb 6.4 oz (76.4 kg)   BMI 26.38 kg/m  CONSTITUTIONAL: Well-developed, well-nourished female in no acute distress.  HENT:  Normocephalic, atraumatic. External right and left ear normal. Oropharynx is clear and moist EYES: Conjunctivae and EOM are normal. Pupils are equal, round, and reactive to light. No scleral icterus.  NECK: Normal range of motion, supple, no masses SKIN: Skin is warm and dry. No rash noted. Not diaphoretic. No erythema. No pallor. NEUROLOGIC: Alert and oriented to person, place, and time. Normal reflexes, muscle tone coordination. No cranial nerve deficit noted. PSYCHIATRIC: Normal mood and affect. Normal behavior. Normal judgment and thought content. CARDIOVASCULAR: Normal heart rate noted RESPIRATORY: Effort normal, no problems with respiration noted ABDOMEN: Soft, no distention noted.   PELVIC: Normal appearing external genitalia; normal appearing vaginal mucosa, cuff intact by visual inspection and palpation, mild tenderness at cuff MUSCULOSKELETAL: Normal range of motion. No edema noted.  Exam done with chaperone present.  Labs and Imaging No results found.  Assessment & Plan:   1. Post-operative state Doing well Reviewed benign path Pelvic precautions until she has returned ot see Dr. Rip Harbour   Routine preventative health maintenance measures emphasized. Please refer to After Visit Summary for other counseling recommendations.   Return in about 4 weeks (around 05/10/2020) for with Dr. Rip Harbour.  Total face-to-face time with  patient: 12 minutes. Over 50% of encounter was spent on counseling and coordination of care.  Feliz Beam, M.D. Attending Center for [name] [facility] manager)

## 2020-05-18 ENCOUNTER — Ambulatory Visit: Payer: BC Managed Care – PPO | Admitting: Obstetrics and Gynecology

## 2020-05-24 ENCOUNTER — Telehealth: Payer: Self-pay | Admitting: Obstetrics and Gynecology

## 2020-05-24 NOTE — Telephone Encounter (Signed)
Patient has requested an appointment to see Dr Rip Harbour. However, because she has something going on and would like to speak to a nurse until this appointment.

## 2020-06-12 ENCOUNTER — Ambulatory Visit: Payer: BC Managed Care – PPO | Admitting: Obstetrics and Gynecology

## 2020-08-08 DIAGNOSIS — R300 Dysuria: Secondary | ICD-10-CM | POA: Diagnosis not present

## 2020-08-21 IMAGING — CR DG PELVIS 1-2V
2 series · 2 of 2 positions shown · non-contrast
Comparison: None.

CLINICAL DATA: Evaluate for IUD

EXAM:
PELVIS - 1-2 VIEW

[t pelvis ap (1 of 2)]
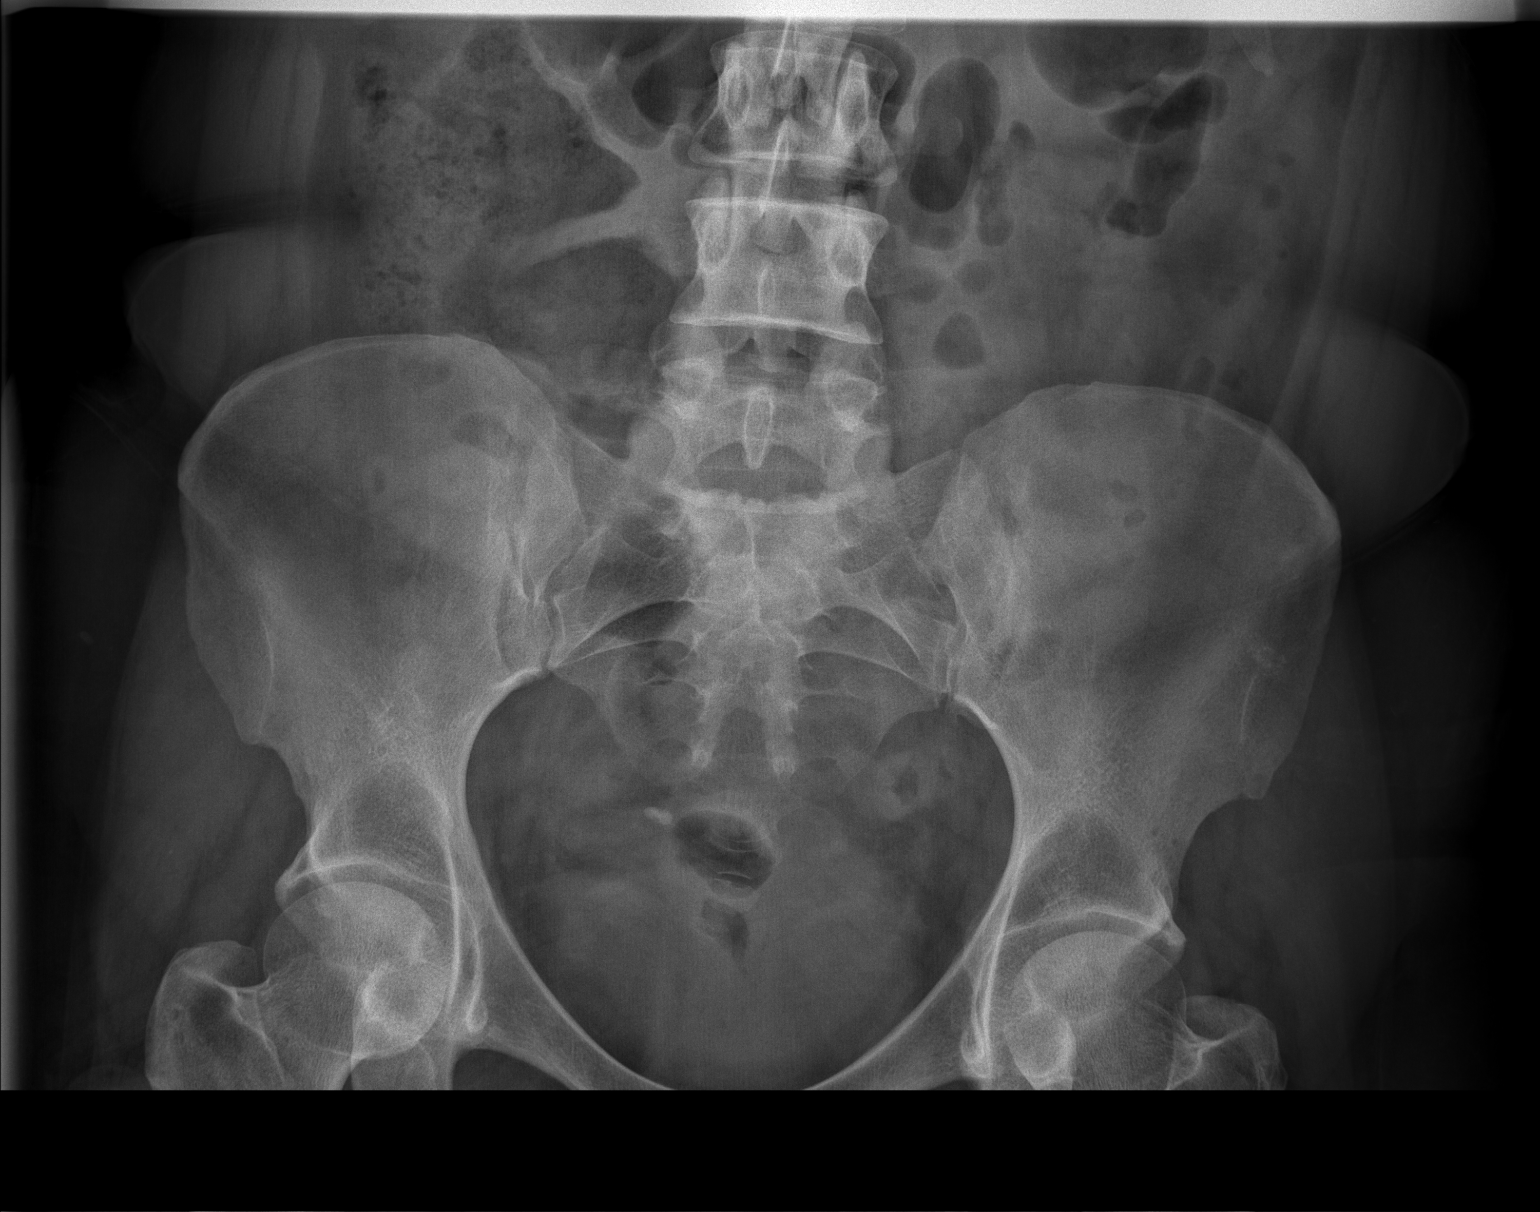

[t pelvis ap (2 of 2)]
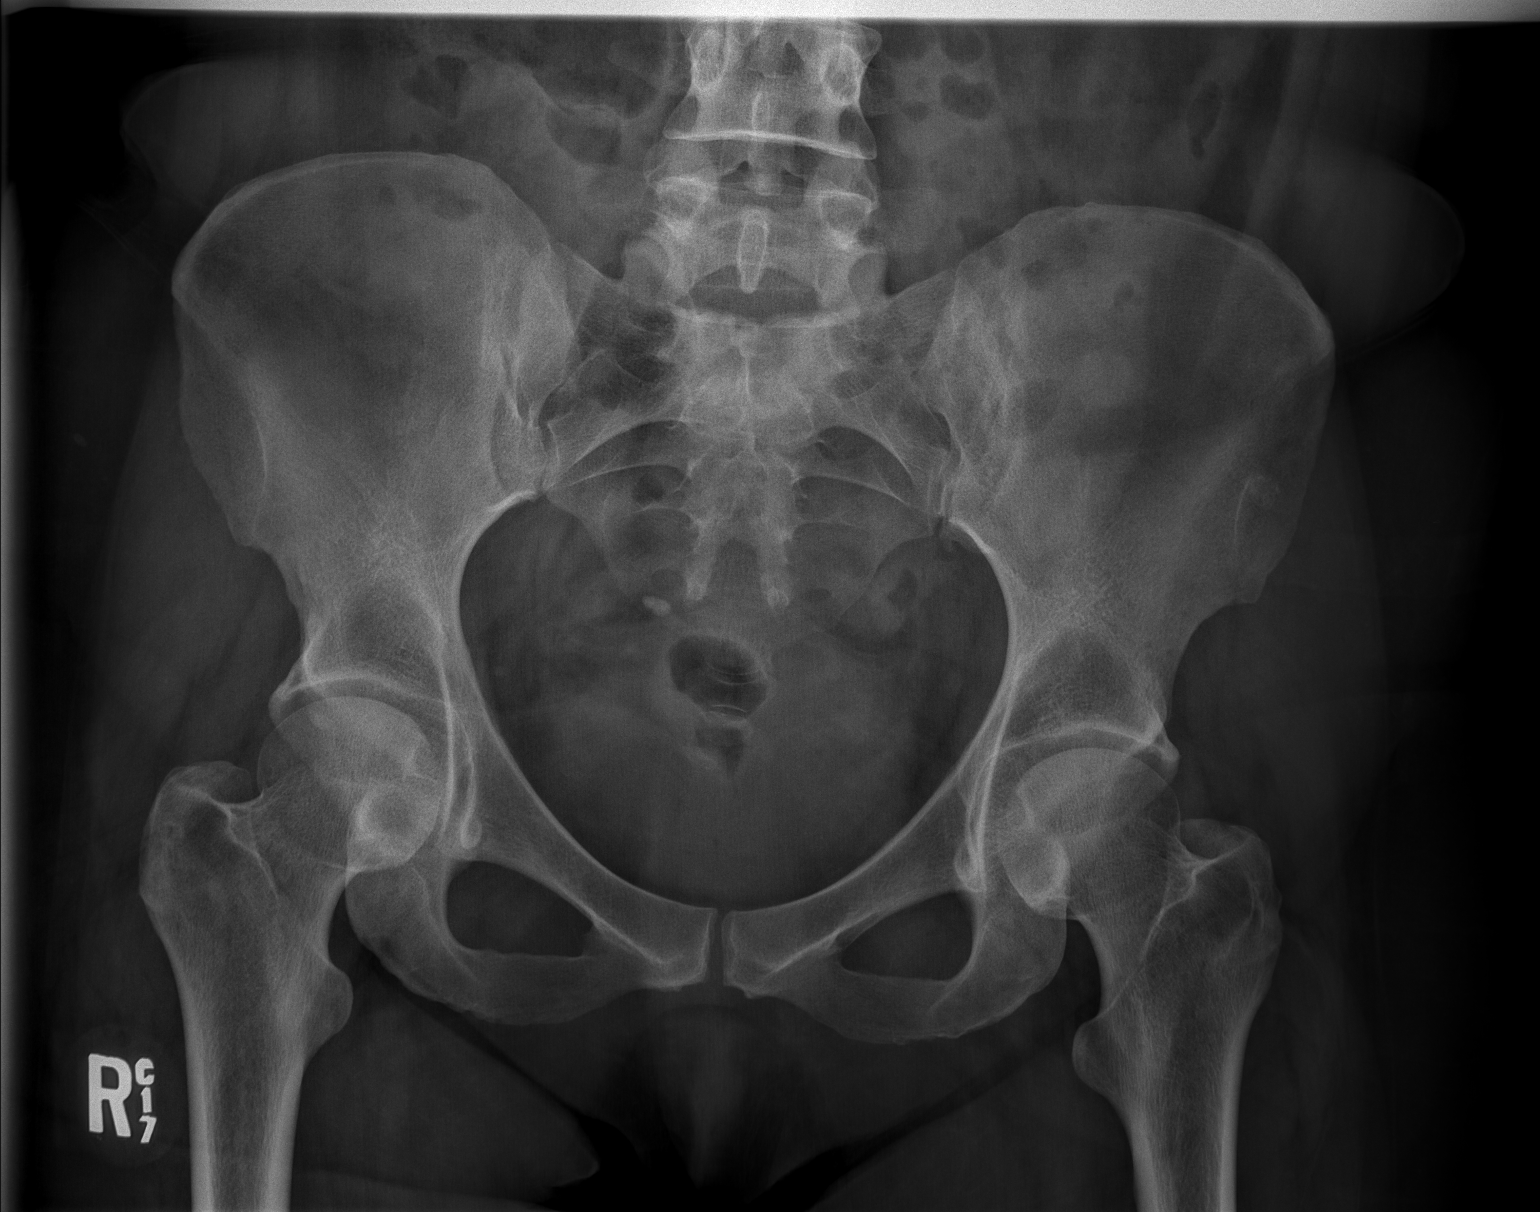

[2 of 2 positions shown; findings below may reference images not displayed]

FINDINGS: There is no evidence of pelvic fracture or diastasis. No pelvic bone
lesions are seen. Small calcified pelvic phlebolith. No IUD or other
radiopaque foreign bodies identified within the pelvis.
IMPRESSION: No IUD visualized.

## 2020-09-26 ENCOUNTER — Telehealth: Payer: Self-pay | Admitting: *Deleted

## 2020-09-26 NOTE — Telephone Encounter (Addendum)
Patient called in stating she had a hysterectomy in June 2021. For last month she has had intermittent throbbing pelvic pain. Can't get in to GYN till Feb. Appt given for tomorrow with [facility]. Kinnie Feil, BSN, RN-BC

## 2020-09-27 ENCOUNTER — Ambulatory Visit (INDEPENDENT_AMBULATORY_CARE_PROVIDER_SITE_OTHER): Payer: BC Managed Care – PPO | Admitting: Internal Medicine

## 2020-09-27 ENCOUNTER — Other Ambulatory Visit (HOSPITAL_COMMUNITY)
Admission: RE | Admit: 2020-09-27 | Discharge: 2020-09-27 | Disposition: A | Discharge status: Home / Self Care | Payer: BC Managed Care – PPO | Source: Ambulatory Visit | Attending: Internal Medicine | Admitting: Internal Medicine

## 2020-09-27 ENCOUNTER — Encounter: Payer: Self-pay | Admitting: Internal Medicine

## 2020-09-27 VITALS — BP 123/79 | HR 70 | Temp 98.0°F | Ht 67.0 in | Wt 167.7 lb

## 2020-09-27 DIAGNOSIS — Z113 Encounter for screening for infections with a predominantly sexual mode of transmission: Secondary | ICD-10-CM | POA: Diagnosis not present

## 2020-09-27 DIAGNOSIS — N760 Acute vaginitis: Secondary | ICD-10-CM | POA: Insufficient documentation

## 2020-09-27 DIAGNOSIS — R1030 Lower abdominal pain, unspecified: Secondary | ICD-10-CM

## 2020-09-27 DIAGNOSIS — B9689 Other specified bacterial agents as the cause of diseases classified elsewhere: Secondary | ICD-10-CM | POA: Insufficient documentation

## 2020-09-27 DIAGNOSIS — R109 Unspecified abdominal pain: Secondary | ICD-10-CM | POA: Insufficient documentation

## 2020-09-27 NOTE — Patient Instructions (Signed)
Thank you, [name] for allowing [place] to provide your care today. Today we discussed abdominal pain/intravaginal pain.    I have ordered the following labs for you:   Lab Orders     Culture, Urine     Urinalysis, Reflex Microscopic   Tests ordered today:  Cervical Swab  Referrals ordered today:   Referral Orders  No referral(s) requested today     I have ordered the following medication/changed the following medications:   Stop the following medications: There are no discontinued medications.   Start the following medications: No orders of the defined types were placed in this encounter.    Follow up: as needed    Should you have any questions or concerns please call the internal medicine clinic at [phone].     Dellia Cloud, D.O. [facility]

## 2020-09-28 ENCOUNTER — Encounter: Payer: Self-pay | Admitting: Internal Medicine

## 2020-09-28 ENCOUNTER — Telehealth: Payer: Self-pay | Admitting: Internal Medicine

## 2020-09-28 ENCOUNTER — Other Ambulatory Visit: Payer: Self-pay | Admitting: Internal Medicine

## 2020-09-28 DIAGNOSIS — N760 Acute vaginitis: Secondary | ICD-10-CM

## 2020-09-28 DIAGNOSIS — B9689 Other specified bacterial agents as the cause of diseases classified elsewhere: Secondary | ICD-10-CM

## 2020-09-28 DIAGNOSIS — R109 Unspecified abdominal pain: Secondary | ICD-10-CM | POA: Insufficient documentation

## 2020-09-28 LAB — URINALYSIS, ROUTINE W REFLEX MICROSCOPIC
Bilirubin, UA: NEGATIVE
Glucose, UA: NEGATIVE
Ketones, UA: NEGATIVE
Leukocytes,UA: NEGATIVE
Nitrite, UA: NEGATIVE
Protein,UA: NEGATIVE
RBC, UA: NEGATIVE
Specific Gravity, UA: 1.014 (ref 1.005–1.030)
Urobilinogen, Ur: 0.2 mg/dL (ref 0.2–1.0)
pH, UA: 7 (ref 5.0–7.5)

## 2020-09-28 LAB — CERVICOVAGINAL ANCILLARY ONLY
Bacterial Vaginitis (gardnerella): POSITIVE — AB
Candida Glabrata: NEGATIVE
Candida Vaginitis: NEGATIVE
Chlamydia: NEGATIVE
Comment: NEGATIVE
Comment: NEGATIVE
Comment: NEGATIVE
Comment: NEGATIVE
Comment: NEGATIVE
Comment: NORMAL
Neisseria Gonorrhea: NEGATIVE
Trichomonas: NEGATIVE

## 2020-09-28 MED ORDER — METRONIDAZOLE 500 MG PO TABS
500.0000 mg | ORAL_TABLET | Freq: Two times a day (BID) | ORAL | 0 refills | Status: AC
Start: 2020-09-28 — End: 2020-10-05

## 2020-09-28 NOTE — Progress Notes (Signed)
CC: .Abdominal pain   HPI:  [name] is a 45 y.o. female with a past medical history stated below and presents today for abdominal pain. Please see problem based assessment and plan for additional details.  Past Medical History:  Diagnosis Date  . Anemia   . Anxiety   . Asthma    With Exacerbations  . Constipation    SP normal colonoscopy 7/08, GI lebaur Dr. Elicia Lamp.  . Displacement of intrauterine contraceptive device 06/07/2019  . GERD (gastroesophageal reflux disease)    pt. denies  . Hepatitis B immune   . Palpitations    Chronic    Current Outpatient Medications on File Prior to Visit  Medication Sig Dispense Refill  . albuterol (VENTOLIN HFA) 108 (90 Base) MCG/ACT inhaler Inhale 2 puffs into the lungs every 6 (six) hours as needed for wheezing or shortness of breath. (Patient not taking: Reported on 04/12/2020) 3 Inhaler 3  . ibuprofen (ADVIL) 800 MG tablet TAKE 1 TABLET(800 MG) BY MOUTH THREE TIMES DAILY (Patient not taking: Reported on 04/12/2020) 30 tablet 5  . iron polysaccharides (NU-IRON) 150 MG capsule Take 1 capsule (150 mg total) by mouth daily. 90 capsule 3  . oxyCODONE-acetaminophen (PERCOCET/ROXICET) 5-325 MG tablet Take 1 tablet by mouth every 4 (four) hours as needed for moderate pain. (Patient not taking: Reported on 04/12/2020) 30 tablet 0   No current facility-administered medications on file prior to visit.    Family History  Problem Relation Age of Onset  . Hypertension Mother   . Colon polyps Mother   . Diabetes Mother   . Heart disease Mother   . BRCA 1/2 Maternal Grandmother 80  . Throat cancer Maternal Aunt   . Lung cancer Maternal Grandfather   . Rectal cancer Brother   . Colon cancer Neg Hx   . Esophageal cancer Neg Hx     Social History   Socioeconomic History  . Marital status: Widowed    Spouse name: Not on file  . Number of children: 2  . Years of education: Not on file  . Highest education level: Not on file   Occupational History  . Occupation: CMA  Tobacco Use  . Smoking status: Former Smoker    Packs/day: 0.10    Types: Cigarettes    Quit date: 07/22/2009    Years since quitting: 11.1  . Smokeless tobacco: Never Used  Vaping Use  . Vaping Use: Never used  Substance and Sexual Activity  . Alcohol use: Yes    Alcohol/week: 0.0 standard drinks    Comment: Only socially.   . Drug use: No  . Sexual activity: Yes    Birth control/protection: None    Comment: tubal  Other Topics Concern  . Not on file  Social History Narrative   Lives with husband who is very ill. Pt does not work.       Financial assistance approved for 100% discount at [facility] and has [facility] card.    Social Determinants of Health   Financial Resource Strain: Not on file  Food Insecurity: Not on file  Transportation Needs: Not on file  Physical Activity: Not on file  Stress: Not on file  Social Connections: Not on file  Intimate Partner Violence: Not on file    Review of Systems: ROS negative except for what is noted on the assessment and plan.  Vitals:   09/27/20 1522  BP: 123/79  Pulse: 70  Temp: 98 F (36.7 C)  TempSrc: Oral  SpO2: 98%  Weight: 167 lb 11.2 oz (76.1 kg)  Height: '5\' 7"'  (1.702 m)     Physical Exam: Physical Exam Constitutional:      Appearance: Normal appearance.  HENT:     Head: Normocephalic and atraumatic.  Cardiovascular:     Rate and Rhythm: Normal rate.     Pulses: Normal pulses.     Heart sounds: Normal heart sounds.  Pulmonary:     Effort: Pulmonary effort is normal.     Breath sounds: Normal breath sounds.  Abdominal:     General: Bowel sounds are normal. There is no distension.     Palpations: Abdomen is soft. There is no mass.     Tenderness: There is no abdominal tenderness. There is no guarding.  Musculoskeletal:        General: Normal range of motion.     Cervical back: Normal range of motion.     Right lower leg: No edema.     Left lower leg: No edema.   Skin:    General: Skin is warm and dry.  Neurological:     Mental Status: She is alert and oriented to person, place, and time. Mental status is at baseline.  Psychiatric:        Mood and Affect: Mood normal.      Assessment & Plan:   See Encounters Tab for problem based charting.  Patient seen with Dr. Bertell Maria, D.O. [facility], PGY-2 Pager: [phone], Phone: [phone] Date 09/28/2020 Time 7:21 AM

## 2020-09-28 NOTE — Assessment & Plan Note (Signed)
Patient presents with 1 week history of lower abdominal pain.  Patient states that she is status post laparoscopic hysterectomy back in October.  Specifically, she still has her ovaries but does not have her uterus or cervix.  Patient states that over the past week she remembers coughing forcefully at work and noticed significant pain in her suprapubic region.  She states that the pain felt as though it was coming from her surgical site at the posterior aspect of her vagina.  The pain only lasted for several minutes but since that time she feels as though "things are very sensitive down there".  She denies any fevers, chills dysuria, hematuria, discharge, or change in odor.  Patient is concerned regarding her surgical site and presents today for pelvic exam for further evaluation and management.  Pelvic exam was performed by Dr. Criselda Peaches.  She notes vaginal discharge in vault.  Plan: - Pelvic exam form today. -Urinalysis with culture -Vaginal swab for GC/chlamydia, BV, trichomonas, candidiasis

## 2020-09-28 NOTE — Progress Notes (Signed)
Internal Medicine Clinic Attending  I saw and evaluated the patient.  I personally confirmed the key portions of the history and exam documented by Dr. Marchia Bond and I reviewed pertinent patient test results.  The assessment, diagnosis, and plan were formulated together and I agree with the documentation in the resident's note.   Per Patient request, I performed GU exam today.  She has one small skin tag at the 7 o'clock position of the right outer labia.  She has some erythema to the vaginal opening.  She has vaginal dryness.  No wound or lesion.  Absent cervical os.  She has possibly some mild anterior prolapse of the vaginal wall.  Thick foamy discharge coating vaginal vault interior.  Sample taken and sent for testing.

## 2020-09-28 NOTE — Telephone Encounter (Signed)
Patient called regarding her results. Metronidazole 500 mg BID was prescribed to her pharmacy. Patient admits to understanding.   Chari Manning, D.O.  Internal Medicine Resident, PGY-2 [facility]  Pager: [phone] 5:35 PM, 09/28/2020

## 2020-09-29 LAB — URINE CULTURE

## 2020-11-08 ENCOUNTER — Other Ambulatory Visit: Payer: Self-pay

## 2020-11-08 ENCOUNTER — Ambulatory Visit (INDEPENDENT_AMBULATORY_CARE_PROVIDER_SITE_OTHER): Payer: BC Managed Care – PPO | Admitting: Internal Medicine

## 2020-11-08 ENCOUNTER — Encounter: Payer: Self-pay | Admitting: Internal Medicine

## 2020-11-08 VITALS — BP 127/91 | HR 70 | Temp 98.0°F | Ht 67.0 in | Wt 162.3 lb

## 2020-11-08 DIAGNOSIS — R300 Dysuria: Secondary | ICD-10-CM

## 2020-11-08 LAB — POCT URINALYSIS DIPSTICK
Glucose, UA: NEGATIVE
Nitrite, UA: NEGATIVE
Protein, UA: POSITIVE — AB
Spec Grav, UA: >=1.03 — AB (ref 1.010–1.025)
Urobilinogen, UA: 0.2 E.U./dL
pH, UA: 5.5 (ref 5.0–8.0)

## 2020-11-08 MED ORDER — NITROFURANTOIN MONOHYD MACRO 100 MG PO CAPS: 100.0000 mg | ORAL_CAPSULE | Freq: Two times a day (BID) | ORAL | 0 refills | Status: AC

## 2020-11-08 NOTE — Patient Instructions (Signed)
Thank you for allowing [place] to provide your care today. Today we discussed your urine symptoms    I have ordered urine culture labs for you. I will call if any are abnormal.    Today we made the following changes to your medications.    Macrobid 100mg  for 5 days  Please follow-up as needed if your symptoms return.    Should you have any questions or concerns please call the internal medicine clinic at [phone].     Urinary Tract Infection, Adult A urinary tract infection (UTI) is an infection of any part of the urinary tract. The urinary tract includes:  The kidneys.  The ureters.  The bladder.  The urethra. These organs make, store, and get rid of pee (urine) in the body. What are the causes? This infection is caused by germs (bacteria) in your genital area. These germs grow and cause swelling (inflammation) of your urinary tract. What increases the risk? The following factors may make you more likely to develop this condition:  Using a small, thin tube (catheter) to drain pee.  Not being able to control when you pee or poop (incontinence).  Being female. If you are female, these things can increase the risk: ? Using these methods to prevent pregnancy:  A medicine that kills sperm (spermicide).  A device that blocks sperm (diaphragm). ? Having low levels of a female hormone (estrogen). ? Being pregnant. You are more likely to develop this condition if:  You have genes that add to your risk.  You are sexually active.  You take antibiotic medicines.  You have trouble peeing because of: ? A prostate that is bigger than normal, if you are female. ? A blockage in the part of your body that drains pee from the bladder. ? A kidney stone. ? A nerve condition that affects your bladder. ? Not getting enough to drink. ? Not peeing often enough.  You have other conditions, such as: ? Diabetes. ? A weak disease-fighting system (immune system). ? Sickle cell  disease. ? Gout. ? Injury of the spine. What are the signs or symptoms? Symptoms of this condition include:  Needing to pee right away.  Peeing small amounts often.  Pain or burning when peeing.  Blood in the pee.  Pee that smells bad or not like normal.  Trouble peeing.  Pee that is cloudy.  Fluid coming from the vagina, if you are female.  Pain in the belly or lower back. Other symptoms include:  Vomiting.  Not feeling hungry.  Feeling mixed up (confused). This may be the first symptom in older adults.  Being tired and grouchy (irritable).  A fever.  Watery poop (diarrhea). How is this treated?  Taking antibiotic medicine.  Taking other medicines.  Drinking enough water. In some cases, you may need to see a specialist. Follow these instructions at home: Medicines  Take over-the-counter and prescription medicines only as told by your doctor.  If you were prescribed an antibiotic medicine, take it as told by your doctor. Do not stop taking it even if you start to feel better. General instructions  Make sure you: ? Pee until your bladder is empty. ? Do not hold pee for a long time. ? Empty your bladder after sex. ? Wipe from front to back after peeing or pooping if you are a female. Use each tissue one time when you wipe.  Drink enough fluid to keep your pee pale yellow.  Keep all follow-up visits.   Contact  a doctor if:  You do not get better after 1-2 days.  Your symptoms go away and then come back. Get help right away if:  You have very bad back pain.  You have very bad pain in your lower belly.  You have a fever.  You have chills.  You feeling like you will vomit or you vomit. Summary  A urinary tract infection (UTI) is an infection of any part of the urinary tract.  This condition is caused by germs in your genital area.  There are many risk factors for a UTI.  Treatment includes antibiotic medicines.  Drink enough fluid to  keep your pee pale yellow. This information is not intended to replace advice given to you by your health care provider. Make sure you discuss any questions you have with your health care provider. Document Revised: 04/21/2020 Document Reviewed: 04/21/2020 Elsevier Patient Education  [place].

## 2020-11-08 NOTE — Assessment & Plan Note (Addendum)
Megan Richardson is a 45 yo F w/ PMH of recent laparoscopic hysterectomy presenting to [facility] with complaint of dysuria. She mentions that since her surgery in October, she has been having difficulty with discomfort in her lower abdominal region and has noticed persistent hematuria and dysuria. She was seen at [facility] last month for similar complaints and found to have bacterial vaginosis. She took a course of metronidazole with some improvement but over the last couple days have noticed worsening dysuria with trace blood when she wipes. She states she mentions having subjective fever overnight but did not take her temperature and mentions brief L sided flank pain. She also mentions she is currently sexually active with monogamous partner. Uses barrier protection intermittently  A/P On exam, no CVA tenderness to suggest pyelonephritis. UA dipstick with leukocytes, trace protein and blood. Vitals stable. Afebrile. Likely uncomplicated UTI. Will treat as such. Could also be a complication of her surgery if her ureter was damaged but negative UA on prior visit makes this unlikely. - F/u urine culture - Macrobid 100mg  5 days - Advised to f/u with surgery

## 2020-11-08 NOTE — Progress Notes (Signed)
   CC: Burning with urination  HPI: [name] is a 45 y.o. with PMH listed below presenting with complaint of dysuria. Please see problem based assessment and plan for further details.  Past Medical History:  Diagnosis Date  . Anemia   . Anxiety   . Asthma    With Exacerbations  . Constipation    SP normal colonoscopy 7/08, GI lebaur Dr. Elicia Richardson.  . Displacement of intrauterine contraceptive device 06/07/2019  . GERD (gastroesophageal reflux disease)    pt. denies  . Hepatitis B immune   . Palpitations    Chronic   Review of Systems: Review of Systems  Constitutional: Positive for fever (Did not take temp). Negative for chills and malaise/fatigue.  Respiratory: Negative for shortness of breath.   Cardiovascular: Negative for chest pain.  Gastrointestinal: Negative for constipation, diarrhea, nausea and vomiting.  Genitourinary: Positive for dysuria, flank pain, hematuria and urgency. Negative for frequency.  Neurological: Negative for dizziness, sensory change, weakness and headaches.  All other systems reviewed and are negative.   Physical Exam: Vitals:   11/08/20 0833 11/08/20 0838  BP: 139/90 (!) 127/91  Pulse: 66 70  Temp: 98 F (36.7 C)   SpO2: 100%   Weight: 162 lb 4.8 oz (73.6 kg)   Height: 5\' 7"  (1.702 m)    Gen: Well-developed, well nourished, NAD HEENT: NCAT head, hearing intact Resp: Breathing comfortably on room air Abd: Soft, BS+, NTND, no CVA tenderness Extm: ROM intact, No peripheral edema Skin: Dry, Warm, normal turgor  Assessment & Plan:   Dysuria [name] is a 46 yo F w/ PMH of recent laparoscopic hysterectomy presenting to [facility] with complaint of dysuria. She mentions that since her surgery in October, she has been having difficulty with discomfort in her lower abdominal region and has noticed persistent hematuria and dysuria. She was seen at [facility] last month for similar complaints and found to have bacterial vaginosis. She took a course  of metronidazole with some improvement but over the last couple days have noticed worsening dysuria with trace blood when she wipes. She states she mentions having subjective fever overnight but did not take her temperature and mentions brief L sided flank pain. She also mentions she is currently sexually active with monogamous partner. Uses barrier protection intermittently  A/P On exam, no CVA tenderness to suggest pyelonephritis. UA dipstick with leukocytes, trace protein and blood. Vitals stable. Afebrile. Likely uncomplicated UTI. Will treat as such. Could also be a complication of her surgery if her ureter was damaged but negative UA on prior visit makes this unlikely. - F/u urine culture - Macrobid 100mg  5 days. - Advised to f/u with surgeon for further evaluation   Patient discussed with Dr. Daryll Drown  -Gilberto Better, [facility] Pager: [phone]

## 2020-11-10 ENCOUNTER — Telehealth: Payer: Self-pay | Admitting: Internal Medicine

## 2020-11-10 LAB — URINE CULTURE

## 2020-11-10 NOTE — Telephone Encounter (Signed)
Discussed result of urine culture with Ms.Tramble and lack of specific organism. Ms.Yonker expressed understanding.

## 2020-11-15 NOTE — Progress Notes (Signed)
Internal Medicine Clinic Attending  Case discussed with Dr. Lee  At the time of the visit.  We reviewed the resident's history and exam and pertinent patient test results.  I agree with the assessment, diagnosis, and plan of care documented in the resident's note.    

## 2021-03-27 ENCOUNTER — Encounter: Payer: Self-pay | Admitting: *Deleted

## 2021-04-26 ENCOUNTER — Ambulatory Visit (INDEPENDENT_AMBULATORY_CARE_PROVIDER_SITE_OTHER): Payer: Self-pay | Admitting: Plastic Surgery

## 2021-04-26 ENCOUNTER — Encounter: Payer: Self-pay | Admitting: Plastic Surgery

## 2021-04-26 ENCOUNTER — Other Ambulatory Visit: Payer: Self-pay

## 2021-04-26 DIAGNOSIS — Z719 Counseling, unspecified: Secondary | ICD-10-CM

## 2021-04-26 NOTE — Progress Notes (Signed)
The patient is a 45 yrs old female here for a consultation for facial rejuvenation.  She has a very nice facial shape and skin.  There is a small amount of hyperpigmentation. She is a Fitz 6.  She has mild midface volume loss.  She is interested in replacing volume in her cheeks and upper lips.  She would like to stay with minimally invasive options.    Pictures were obtained of the patient and placed in the chart with the patient's or guardian's permission.  We discused Kysse lip filler, BBL and Halo, skin care with Sente and Voluma for the cheeks.  She is going to make an appointment for the lip filler as that is where she would like to start.

## 2021-05-18 ENCOUNTER — Ambulatory Visit (INDEPENDENT_AMBULATORY_CARE_PROVIDER_SITE_OTHER): Payer: Self-pay | Admitting: Plastic Surgery

## 2021-05-18 ENCOUNTER — Other Ambulatory Visit: Payer: Self-pay

## 2021-05-18 ENCOUNTER — Encounter: Payer: Self-pay | Admitting: Plastic Surgery

## 2021-05-18 DIAGNOSIS — Z719 Counseling, unspecified: Secondary | ICD-10-CM

## 2021-05-18 NOTE — Progress Notes (Signed)
Filler Injection Procedure Note  Procedure:  Filler administration  Pre-operative Diagnosis: Loss of volume in the lips  Post-operative Diagnosis: Same  Complications:  None  Brief history: The patient desires injection with fillers in her face. I discussed with the patient this proposed procedure of filler injections, which is customized depending on the particular needs of the patient. It is performed on facial volume loss as a temporary correction. The alternatives were discussed with the patient. The risks were addressed including bleeding, scarring, infection, damage to deeper structures, asymmetry, and chronic pain, which may occur infrequently after a procedure. The individual's choice to undergo a surgical procedure is based on the comparison of risks to potential benefits. Other risks include unsatisfactory results, allergic reaction, which should go away with time, bruising and delayed healing. Fillers do not arrest the aging process or produce permanent tightening.  Operative intervention maybe necessary to maintain the results. The patient understands and wishes to proceed. An informed consent was signed and informational brochures given to her prior to the procedure.  Procedure: The area was prepped with alcohol and dried with a clean gauze. Using a clean technique, a 30 gauge needle was then used to make a pinprick opening in the lateral portion of the lip.  The cannula was then placed and using 0.2 increments I injected the filler.  This was done in each of the 4 quadrants.  Time was allowed for the patient to assess her look.  We have 0.2 cc left and agreed to let the patient see how she likes it over the weekend.  This was done with one syringe.    No complications were noted. Light pressure was held for 5 minutes. She was instructed explicitly in post-operative care.  Restylane Kysse LOT: [id number] EXP: 2021-08-22

## 2021-05-19 ENCOUNTER — Emergency Department (HOSPITAL_BASED_OUTPATIENT_CLINIC_OR_DEPARTMENT_OTHER): Payer: 59

## 2021-05-19 ENCOUNTER — Telehealth: Payer: Self-pay | Admitting: Plastic Surgery

## 2021-05-19 ENCOUNTER — Emergency Department (HOSPITAL_BASED_OUTPATIENT_CLINIC_OR_DEPARTMENT_OTHER)
Admission: EM | Admit: 2021-05-19 | Discharge: 2021-05-19 | Disposition: A | Discharge status: Home / Self Care | Payer: 59 | Attending: Emergency Medicine | Admitting: Emergency Medicine

## 2021-05-19 ENCOUNTER — Other Ambulatory Visit: Payer: Self-pay

## 2021-05-19 ENCOUNTER — Encounter (HOSPITAL_BASED_OUTPATIENT_CLINIC_OR_DEPARTMENT_OTHER): Payer: Self-pay

## 2021-05-19 DIAGNOSIS — W101XXA Fall (on)(from) sidewalk curb, initial encounter: Secondary | ICD-10-CM | POA: Diagnosis not present

## 2021-05-19 DIAGNOSIS — S8252XA Displaced fracture of medial malleolus of left tibia, initial encounter for closed fracture: Secondary | ICD-10-CM | POA: Diagnosis not present

## 2021-05-19 DIAGNOSIS — J45909 Unspecified asthma, uncomplicated: Secondary | ICD-10-CM | POA: Diagnosis not present

## 2021-05-19 DIAGNOSIS — Z87891 Personal history of nicotine dependence: Secondary | ICD-10-CM | POA: Diagnosis not present

## 2021-05-19 DIAGNOSIS — M7989 Other specified soft tissue disorders: Secondary | ICD-10-CM | POA: Diagnosis not present

## 2021-05-19 DIAGNOSIS — S99912A Unspecified injury of left ankle, initial encounter: Secondary | ICD-10-CM | POA: Diagnosis present

## 2021-05-19 MED ORDER — ACETAMINOPHEN 500 MG PO TABS
1000.0000 mg | ORAL_TABLET | Freq: Once | ORAL | Status: AC
  Administered 2021-05-19: 1000 mg via ORAL
  Filled 2021-05-19: qty 2

## 2021-05-19 MED ORDER — LIDOCAINE 5 % EX PTCH
1.0000 | MEDICATED_PATCH | CUTANEOUS | Status: DC
  Administered 2021-05-19: 1 via TRANSDERMAL
  Filled 2021-05-19: qty 1

## 2021-05-19 MED ORDER — NAPROXEN 375 MG PO TABS: 375.0000 mg | ORAL_TABLET | Freq: Two times a day (BID) | ORAL | 0 refills | Status: AC

## 2021-05-19 MED ORDER — TRAMADOL HCL 50 MG PO TABS: 50.0000 mg | ORAL_TABLET | Freq: Four times a day (QID) | ORAL | 0 refills | Status: AC | PRN

## 2021-05-19 MED ORDER — NAPROXEN 250 MG PO TABS
500.0000 mg | ORAL_TABLET | ORAL | Status: AC
  Administered 2021-05-19: 500 mg via ORAL
  Filled 2021-05-19: qty 2

## 2021-05-19 NOTE — Telephone Encounter (Signed)
Called to check on patient.  States she is doing well.  Mild swelling but no bruising.

## 2021-05-19 NOTE — ED Triage Notes (Signed)
Pt was wearing high-heeled sandals and tripped on a curb three days ago. Pt c/o left ankle pain. Swelling and tenderness noted. Pt is able to bear weight on her foot but it is painful.

## 2021-05-19 NOTE — ED Notes (Addendum)
Pt refused crutches and states she will wear boot and follow up with Ortho.

## 2021-05-19 NOTE — ED Provider Notes (Addendum)
[facility] EMERGENC[facility] DEPT Provider Note   CSN: [id number] Arrival date & time: 05/19/21  2244     History Chief Complaint  Patient presents with   Ankle Pain    Megan Richardson is a 45 y.o. female.  The history is provided by the patient.  Ankle Pain Location:  Ankle Time since incident:  3 days Injury: yes   Mechanism of injury comment:  Twisted L ankle stepping off a curb Ankle location:  L ankle Pain details:    Quality:  Aching   Radiates to:  Does not radiate   Severity:  Moderate   Onset quality:  Sudden   Duration:  3 weeks   Timing:  Constant   Progression:  Unchanged Chronicity:  New Dislocation: no   Foreign body present:  No foreign bodies Tetanus status:  Unknown Relieved by:  Nothing Worsened by:  Nothing Ineffective treatments:  None tried Associated symptoms: no back pain, no decreased ROM, no fatigue, no fever, no itching, no muscle weakness, no neck pain, no numbness, no stiffness, no swelling and no tingling   Risk factors: no concern for non-accidental trauma       Past Medical History:  Diagnosis Date   Anemia    Anxiety    Asthma    With Exacerbations   Constipation    SP normal colonoscopy 7/08, GI lebaur Dr. Elicia Lamp.   Displacement of intrauterine contraceptive device 06/07/2019   GERD (gastroesophageal reflux disease)    pt. denies   Hepatitis B immune    Palpitations    Chronic    Patient Active Problem List   Diagnosis Date Noted   Fibroids 01/13/2020   Abnormal uterine bleeding 06/07/2019   Iron deficiency anemia 08/31/2016   Fatigue 08/29/2016   Dysuria 02/22/2016   HIV exposure 03/06/2015   Encounter for counseling 11/16/2014   GERD (gastroesophageal reflux disease) 04/14/2014   Hyperlipidemia 08/30/2011   Breast mass, right 02/11/2011   ANXIETY STATE, UNSPECIFIED 07/25/2008   Asthma 01/18/2008    Past Surgical History:  Procedure Laterality Date   CESAREAN SECTION     x2   COLONOSCOPY      Tubal ligation  2006   VAGINAL HYSTERECTOMY Bilateral 03/07/2020   Procedure: HYSTERECTOMY VAGINAL WITH SALPINGECTOMY;  Surgeon: Chancy Milroy, MD;  Location: [facility];  Service: Gynecology;  Laterality: Bilateral;     OB History     Gravida  2   Para  2   Term  2   Preterm      AB      Living  2      SAB      IAB      Ectopic      Multiple      Live Births  2           Family History  Problem Relation Age of Onset   Hypertension Mother    Colon polyps Mother    Diabetes Mother    Heart disease Mother    BRCA 1/2 Maternal Grandmother 74   Throat cancer Maternal Aunt    Lung cancer Maternal Grandfather    Rectal cancer Brother    Colon cancer Neg Hx    Esophageal cancer Neg Hx     Social History   Tobacco Use   Smoking status: Former    Packs/day: 0.10    Types: Cigarettes    Quit date: 07/22/2009    Years since quitting: 62.8  Smokeless tobacco: Never  Vaping Use   Vaping Use: Never used  Substance Use Topics   Alcohol use: Yes    Alcohol/week: 0.0 standard drinks    Comment: Only socially.    Drug use: No    Home Medications Prior to Admission medications   Medication Sig Start Date End Date Taking? Authorizing Provider  albuterol (VENTOLIN HFA) 108 (90 Base) MCG/ACT inhaler Inhale 2 puffs into the lungs every 6 (six) hours as needed for wheezing or shortness of breath. 08/29/16   Holley Raring, MD  ibuprofen (ADVIL) 800 MG tablet TAKE 1 TABLET(800 MG) BY MOUTH THREE TIMES DAILY Patient not taking: Reported on 04/12/2020 03/24/20   Shelly Bombard, MD  iron polysaccharides (NU-IRON) 150 MG capsule Take 1 capsule (150 mg total) by mouth daily. 10/06/19   Luvenia Redden, PA-C  oxyCODONE-acetaminophen (PERCOCET/ROXICET) 5-325 MG tablet Take 1 tablet by mouth every 4 (four) hours as needed for moderate pain. Patient not taking: Reported on 04/12/2020 03/08/20   Chancy Milroy, MD    Allergies    Patient has no known  allergies.  Review of Systems   Review of Systems  Constitutional:  Negative for fatigue and fever.  HENT:  Negative for facial swelling.   Eyes:  Negative for redness.  Respiratory:  Negative for wheezing.   Cardiovascular:  Negative for leg swelling.  Gastrointestinal:  Negative for vomiting.  Genitourinary:  Negative for difficulty urinating.  Musculoskeletal:  Negative for back pain, neck pain and stiffness.  Skin:  Negative for itching.  Neurological:  Negative for facial asymmetry.  Psychiatric/Behavioral:  Negative for agitation.   All other systems reviewed and are negative.  Physical Exam Updated Vital Signs BP (!) 144/102 (BP Location: Right Arm)   Pulse 72   Temp 98.2 F (36.8 C) (Oral)   Resp 18   Ht '5\' 7"'  (1.702 m)   Wt 74.8 kg   LMP 02/19/2020   SpO2 100%   BMI 25.84 kg/m   Physical Exam Vitals and nursing note reviewed.  Constitutional:      General: She is not in acute distress.    Appearance: Normal appearance.  HENT:     Head: Normocephalic and atraumatic.     Nose: Nose normal.  Eyes:     Conjunctiva/sclera: Conjunctivae normal.     Pupils: Pupils are equal, round, and reactive to light.  Cardiovascular:     Rate and Rhythm: Normal rate and regular rhythm.     Pulses: Normal pulses.     Heart sounds: Normal heart sounds.  Pulmonary:     Effort: Pulmonary effort is normal.     Breath sounds: Normal breath sounds.  Abdominal:     General: Abdomen is flat. Bowel sounds are normal.     Palpations: Abdomen is soft.     Tenderness: There is no abdominal tenderness. There is no guarding.  Musculoskeletal:        General: Normal range of motion.     Cervical back: Normal range of motion and neck supple.     Right ankle: Normal.     Left ankle: Normal. No deformity or lacerations. Normal range of motion. Anterior drawer test negative. Normal pulse.     Left Achilles Tendon: Normal.     Left foot: Normal.  Skin:    General: Skin is warm and dry.      Capillary Refill: Capillary refill takes less than 2 seconds.  Neurological:     General: No focal deficit  present.     Mental Status: She is alert and oriented to person, place, and time.     Deep Tendon Reflexes: Reflexes normal.  Psychiatric:        Mood and Affect: Mood normal.        Behavior: Behavior normal.    ED Results / Procedures / Treatments   Labs (all labs ordered are listed, but only abnormal results are displayed) Labs Reviewed - No data to display  EKG None  Radiology No results found.  Procedures Procedures   Medications Ordered in ED Medications  lidocaine (LIDODERM) 5 % 1 patch (1 patch Transdermal Patch Applied 05/19/21 2313)  acetaminophen (TYLENOL) tablet 1,000 mg (1,000 mg Oral Given 05/19/21 2313)  naproxen (NAPROSYN) tablet 500 mg (500 mg Oral Given 05/19/21 2313)    ED Course  I have reviewed the triage vital signs and the nursing notes.  Pertinent labs & imaging results that were available during my care of the patient were reviewed by me and considered in my medical decision making (see chart for details).  Ankle sprain:  ice for 20 minutes every 2 hours for 2 days, pain medication ordered.  Cam walker placed in the ED, wear 24/7 until cleared by orthopedics.  Follow up with orthopedics.  Crutches ordered for patient, patient has declined.    ARMINA GALLOWAY was evaluated in Emergency Department on 05/19/2021 for the symptoms described in the history of present illness. She was evaluated in the context of the global COVID-19 pandemic, which necessitated consideration that the patient might be at risk for infection with the SARS-CoV-2 virus that causes COVID-19. Institutional protocols and algorithms that pertain to the evaluation of patients at risk for COVID-19 are in a state of rapid change based on information released by regulatory bodies including the CDC and federal and state organizations. These policies and algorithms were followed during  the patient's care in the ED.  Final Clinical Impression(s) / ED Diagnoses Return for intractable cough, coughing up blood, fevers > 100.4 unrelieved by medication, shortness of breath, intractable vomiting, chest pain, shortness of breath, weakness, numbness, changes in speech, facial asymmetry, abdominal pain, passing out, Inability to tolerate liquids or food, cough, altered mental status or any concerns. No signs of systemic illness or infection. The patient is nontoxic-appearing on exam and vital signs are within normal limits. I have reviewed the triage vital signs and the nursing notes. Pertinent labs & imaging results that were available during my care of the patient were reviewed by me and considered in my medical decision making (see chart for details). After history, exam, and medical workup I feel the patient has been appropriately medically screened and is safe for discharge home. Pertinent diagnoses were discussed with the patient. Patient was given return precautions.    Micki Cassel, MD 05/19/21 2328    Veatrice Kells, MD 05/19/21 [place], Fauna Neuner, MD 05/19/21 2335

## 2021-05-19 NOTE — ED Notes (Signed)
Pt verbalizes understanding of discharge instructions. Opportunity for questioning and answers were provided. Armand removed by staff, pt discharged from ED to home. Educated to f/u with Ortho

## 2021-05-25 ENCOUNTER — Ambulatory Visit (INDEPENDENT_AMBULATORY_CARE_PROVIDER_SITE_OTHER): Payer: Self-pay | Admitting: Plastic Surgery

## 2021-05-25 ENCOUNTER — Other Ambulatory Visit: Payer: Self-pay

## 2021-05-25 ENCOUNTER — Ambulatory Visit
Admission: EM | Admit: 2021-05-25 | Discharge: 2021-05-25 | Disposition: A | Discharge status: Home / Self Care | Payer: 59 | Attending: Emergency Medicine | Admitting: Emergency Medicine

## 2021-05-25 DIAGNOSIS — J029 Acute pharyngitis, unspecified: Secondary | ICD-10-CM

## 2021-05-25 DIAGNOSIS — J069 Acute upper respiratory infection, unspecified: Secondary | ICD-10-CM | POA: Diagnosis not present

## 2021-05-25 DIAGNOSIS — M791 Myalgia, unspecified site: Secondary | ICD-10-CM

## 2021-05-25 DIAGNOSIS — R059 Cough, unspecified: Secondary | ICD-10-CM

## 2021-05-25 DIAGNOSIS — Z719 Counseling, unspecified: Secondary | ICD-10-CM

## 2021-05-25 LAB — POCT RAPID STREP A (OFFICE): Rapid Strep A Screen: NEGATIVE

## 2021-05-25 LAB — POCT INFLUENZA A/B
Influenza A, POC: NEGATIVE
Influenza B, POC: NEGATIVE

## 2021-05-25 NOTE — ED Provider Notes (Signed)
[facility]    CSN: [id number] Arrival date & time: 05/25/21  1247      History   Chief Complaint Chief Complaint  Patient presents with   Fever    HPI Megan Richardson is a 45 y.o. female.   Patient presents with concerns of feeling unwell since yesterday. She has had headache, body aches, fatigue, sore throat, and productive cough. She has felt feverish but not taken her temperature. The patient reports some mild chest tightness as well and has a history of asthma. She has had some waves of nausea but denies vomiting or diarrhea. She has tried Tylenol and ibuprofen with minimal improvement. She denies known sick contacts. She took a home COVID test yesterday that was negative.   The history is provided by the patient.  Fever Associated symptoms: cough, headaches, myalgias, nausea and sore throat   Associated symptoms: no chest pain, no congestion, no ear pain, no rash, no rhinorrhea and no vomiting    Past Medical History:  Diagnosis Date   Anemia    Anxiety    Asthma    With Exacerbations   Constipation    SP normal colonoscopy 7/08, GI lebaur Dr. Elicia Lamp.   Displacement of intrauterine contraceptive device 06/07/2019   GERD (gastroesophageal reflux disease)    pt. denies   Hepatitis B immune    Palpitations    Chronic    Patient Active Problem List   Diagnosis Date Noted   Fibroids 01/13/2020   Abnormal uterine bleeding 06/07/2019   Iron deficiency anemia 08/31/2016   Fatigue 08/29/2016   Dysuria 02/22/2016   HIV exposure 03/06/2015   Encounter for counseling 11/16/2014   GERD (gastroesophageal reflux disease) 04/14/2014   Hyperlipidemia 08/30/2011   Breast mass, right 02/11/2011   ANXIETY STATE, UNSPECIFIED 07/25/2008   Asthma 01/18/2008    Past Surgical History:  Procedure Laterality Date   CESAREAN SECTION     x2   COLONOSCOPY     Tubal ligation  2006   VAGINAL HYSTERECTOMY Bilateral 03/07/2020   Procedure: HYSTERECTOMY VAGINAL WITH  SALPINGECTOMY;  Surgeon: Chancy Milroy, MD;  Location: [facility];  Service: Gynecology;  Laterality: Bilateral;    OB History     Gravida  2   Para  2   Term  2   Preterm      AB      Living  2      SAB      IAB      Ectopic      Multiple      Live Births  2            Home Medications    Prior to Admission medications   Medication Sig Start Date End Date Taking? Authorizing Provider  albuterol (VENTOLIN HFA) 108 (90 Base) MCG/ACT inhaler Inhale 2 puffs into the lungs every 6 (six) hours as needed for wheezing or shortness of breath. 08/29/16   Holley Raring, MD  naproxen (NAPROSYN) 375 MG tablet Take 1 tablet (375 mg total) by mouth 2 (two) times daily. 05/19/21   Palumbo, April, MD  traMADol (ULTRAM) 50 MG tablet Take 1 tablet (50 mg total) by mouth every 6 (six) hours as needed for severe pain. 05/19/21   Palumbo, April, MD    Family History Family History  Problem Relation Age of Onset   Hypertension Mother    Colon polyps Mother    Diabetes Mother    Heart disease Mother  BRCA 1/2 Maternal Grandmother 80   Throat cancer Maternal Aunt    Lung cancer Maternal Grandfather    Rectal cancer Brother    Colon cancer Neg Hx    Esophageal cancer Neg Hx     Social History Social History   Tobacco Use   Smoking status: Former    Packs/day: 0.10    Types: Cigarettes    Quit date: 07/22/2009    Years since quitting: 11.8   Smokeless tobacco: Never  Vaping Use   Vaping Use: Never used  Substance Use Topics   Alcohol use: Yes    Alcohol/week: 0.0 standard drinks    Comment: Only socially.    Drug use: No     Allergies   Patient has no known allergies.   Review of Systems Review of Systems  Constitutional:  Positive for appetite change, fatigue and fever.  HENT:  Positive for sore throat. Negative for congestion, ear pain, rhinorrhea and trouble swallowing.   Eyes:  Negative for redness.  Respiratory:  Positive for cough  and chest tightness. Negative for shortness of breath.   Cardiovascular:  Negative for chest pain.  Gastrointestinal:  Positive for nausea. Negative for vomiting.  Musculoskeletal:  Positive for myalgias.  Skin:  Negative for rash.  Neurological:  Positive for headaches. Negative for dizziness.    Physical Exam Triage Vital Signs ED Triage Vitals  Enc Vitals Group     BP 05/25/21 1307 106/77     Pulse Rate 05/25/21 1307 76     Resp 05/25/21 1307 18     Temp 05/25/21 1307 98.8 F (37.1 C)     Temp Source 05/25/21 1307 Oral     SpO2 05/25/21 1307 97 %     Weight --      Height --      Head Circumference --      Peak Flow --      Pain Score 05/25/21 1306 4     Pain Loc --      Pain Edu? --      Excl. in [place]? --    No data found.  Updated Vital Signs BP 106/77 (BP Location: Right Arm)   Pulse 76   Temp 98.8 F (37.1 C) (Oral)   Resp 18   LMP 02/19/2020   SpO2 97%   Visual Acuity Right Eye Distance:   Left Eye Distance:   Bilateral Distance:    Right Eye Near:   Left Eye Near:    Bilateral Near:     Physical Exam Vitals and nursing note reviewed.  Constitutional:      General: She is not in acute distress. HENT:     Head: Normocephalic and atraumatic.     Nose: Nose normal.     Mouth/Throat:     Mouth: Mucous membranes are moist.     Pharynx: Oropharynx is clear. No oropharyngeal exudate.  Eyes:     Conjunctiva/sclera: Conjunctivae normal.     Pupils: Pupils are equal, round, and reactive to light.  Cardiovascular:     Rate and Rhythm: Normal rate and regular rhythm.     Heart sounds: Normal heart sounds.  Pulmonary:     Effort: Pulmonary effort is normal.     Breath sounds: Normal breath sounds. No wheezing.  Lymphadenopathy:     Cervical: No cervical adenopathy.  Skin:    Findings: No rash.  Neurological:     Mental Status: She is alert.     Gait: Gait normal.  Psychiatric:        Mood and Affect: Mood normal.     UC Treatments / Results   Labs (all labs ordered are listed, but only abnormal results are displayed) Labs Reviewed  NOVEL CORONAVIRUS, NAA  CULTURE, GROUP A STREP [facility])  POCT INFLUENZA A/B  POCT RAPID STREP A (OFFICE)    EKG   Radiology No results found.  Procedures Procedures (including critical care time)  Medications Ordered in UC Medications - No data to display  Initial Impression / Assessment and Plan / UC Course  I have reviewed the triage vital signs and the nursing notes.  Pertinent labs & imaging results that were available during my care of the patient were reviewed by me and considered in my medical decision making (see chart for details).     Strep and flu negative, COVID pending. Sx tx and reassurance.   E/M: 1 acute uncomplicated illness, 4 data (strep, flu, COVID, strep cx), low risk   Final Clinical Impressions(s) / UC Diagnoses   Final diagnoses:  Cough  Myalgia  Acute pharyngitis, unspecified etiology  Viral URI     Discharge Instructions      Strep and flu tests negative. COVID test sent out and results will go to MyChart. Rest, keep hydrated, and continue OTC medicine as needed along with inhaler as needed. Follow-up with PCP if no improvement in a week. Go to the ER if develop difficulty breathing, chest pain, or other new severe symptoms.      ED Prescriptions   None    PDMP not reviewed this encounter.   Delsa Sale, [place] 05/25/21 1404

## 2021-05-25 NOTE — ED Triage Notes (Signed)
Symptoms: sore throat, fever, body aches, headache Started: yesterday

## 2021-05-25 NOTE — Discharge Instructions (Addendum)
Strep and flu tests negative. COVID test sent out and results will go to MyChart. Rest, keep hydrated, and continue OTC medicine as needed along with inhaler as needed. Follow-up with PCP if no improvement in a week. Go to the ER if develop difficulty breathing, chest pain, or other new severe symptoms.

## 2021-05-25 NOTE — Progress Notes (Signed)
Additional filler was placed in the upper lip on both sides and the left lower lip.  This helped with symmetry.  A total of 42 was utilized of the Restylane Kysse.  Patient is very with results.

## 2021-05-26 DIAGNOSIS — U071 COVID-19: Secondary | ICD-10-CM | POA: Diagnosis not present

## 2021-05-26 DIAGNOSIS — J028 Acute pharyngitis due to other specified organisms: Secondary | ICD-10-CM | POA: Diagnosis not present

## 2021-05-26 LAB — NOVEL CORONAVIRUS, NAA: SARS-CoV-2, NAA: DETECTED — AB

## 2021-05-26 LAB — SARS-COV-2, NAA 2 DAY TAT

## 2021-05-28 LAB — CULTURE, GROUP A STREP (THRC)

## 2021-06-04 ENCOUNTER — Ambulatory Visit: Payer: 59 | Admitting: Physician Assistant

## 2021-06-11 ENCOUNTER — Ambulatory Visit (INDEPENDENT_AMBULATORY_CARE_PROVIDER_SITE_OTHER): Payer: 59

## 2021-06-11 ENCOUNTER — Ambulatory Visit (INDEPENDENT_AMBULATORY_CARE_PROVIDER_SITE_OTHER): Payer: 59 | Admitting: Orthopaedic Surgery

## 2021-06-11 ENCOUNTER — Other Ambulatory Visit: Payer: Self-pay

## 2021-06-11 ENCOUNTER — Encounter: Payer: Self-pay | Admitting: Orthopaedic Surgery

## 2021-06-11 DIAGNOSIS — M25572 Pain in left ankle and joints of left foot: Secondary | ICD-10-CM | POA: Diagnosis not present

## 2021-06-11 DIAGNOSIS — S93422D Sprain of deltoid ligament of left ankle, subsequent encounter: Secondary | ICD-10-CM | POA: Diagnosis not present

## 2021-06-11 NOTE — Progress Notes (Signed)
Office Visit Note   Patient: Megan Richardson           Date of Birth: [birth year]           MRN: [id number] Visit Date: 06/11/2021              Requested by: Sanjuan Dame, MD [address] [place],  [place] [address] PCP: Sanjuan Dame, MD   Assessment & Plan: Visit Diagnoses:  1. Pain in left ankle and joints of left foot   2. Tear of deltoid ligament of left ankle, subsequent encounter     Plan: I talked to her about trying an ASO for her left ankle for support and she agrees to this treatment plan.  She understands it can take 6 to 8 weeks for her ankle to feel better and just give this time.  I showed her some Strengthening exercises to try.  All questions and concerns were answered and addressed.  Follow-up can be as needed.  If things worsen she will let us know.  Follow-Up Instructions: Return if symptoms worsen or fail to improve.   Orders:  Orders Placed This Encounter  Procedures   XR Ankle Complete Left   No orders of the defined types were placed in this encounter.     Procedures: No procedures performed   Clinical Data: No additional findings.   Subjective: Chief Complaint  Patient presents with   Left Ankle - Pain  The patient is a 45 year old female referred to the emergency room after having a mechanical fall when she rolled her left ankle about 3 weeks ago.  She was seen in the emergency room and found to have a small avulsion off the tip of her medial malleolus.  She was just given a boot which is appropriate but she said it really got too cumbersome to wear so she has been using wearing an Ace wrap around her ankle in regular shoes.  She says it really does not hurt bad she has not injured this ankle before.  It does not hurt with weightbearing but hurts with certain activities with her ankle.  She is nondiabetic.  She denies any numbness and tingling and denies any instability symptoms  HPI  Review of Systems There is currently listed no  headache, chest pain, shortness of breath, fever, chills, nausea, vomiting  Objective: Vital Signs: LMP 02/19/2020   Physical Exam She is alert and orient x3 and in no acute distress Ortho Exam Examination of the left ankle shows pain over the deltoid ligament of the ankle.  Otherwise the remainder of her ankle exam is entirely normal.  There is no lateral pain.  There is no instability on exam and range of motion is full.  Her ankle is neurovascularly intact. Specialty Comments:  No specialty comments available.  Imaging: XR Ankle Complete Left  Result Date: 06/11/2021 3 views the left ankle show a small avulsion off of the tip of the medial malleolus.  The ankle mortise is congruent.  There is no significant soft tissue swelling.    PMFS History: Patient Active Problem List   Diagnosis Date Noted   Fibroids 01/13/2020   Abnormal uterine bleeding 06/07/2019   Iron deficiency anemia 08/31/2016   Fatigue 08/29/2016   Dysuria 02/22/2016   HIV exposure 03/06/2015   Encounter for counseling 11/16/2014   GERD (gastroesophageal reflux disease) 04/14/2014   Hyperlipidemia 08/30/2011   Breast mass, right 02/11/2011   ANXIETY STATE, UNSPECIFIED 07/25/2008   Asthma 01/18/2008  Past Medical History:  Diagnosis Date   Anemia    Anxiety    Asthma    With Exacerbations   Constipation    SP normal colonoscopy 7/08, GI lebaur Dr. Elicia Lamp.   Displacement of intrauterine contraceptive device 06/07/2019   GERD (gastroesophageal reflux disease)    pt. denies   Hepatitis B immune    Palpitations    Chronic    Family History  Problem Relation Age of Onset   Hypertension Mother    Colon polyps Mother    Diabetes Mother    Heart disease Mother    BRCA 1/2 Maternal Grandmother 5   Throat cancer Maternal Aunt    Lung cancer Maternal Grandfather    Rectal cancer Brother    Colon cancer Neg Hx    Esophageal cancer Neg Hx     Past Surgical History:  Procedure Laterality Date    CESAREAN SECTION     x2   COLONOSCOPY     Tubal ligation  2006   VAGINAL HYSTERECTOMY Bilateral 03/07/2020   Procedure: HYSTERECTOMY VAGINAL WITH SALPINGECTOMY;  Surgeon: Chancy Milroy, MD;  Location: [facility];  Service: Gynecology;  Laterality: Bilateral;   Social History   Occupational History   Occupation: CMA  Tobacco Use   Smoking status: Former    Packs/day: 0.10    Types: Cigarettes    Quit date: 07/22/2009    Years since quitting: 11.8   Smokeless tobacco: Never  Vaping Use   Vaping Use: Never used  Substance and Sexual Activity   Alcohol use: Yes    Alcohol/week: 0.0 standard drinks    Comment: Only socially.    Drug use: No   Sexual activity: Yes    Birth control/protection: None    Comment: tubal

## 2021-12-10 ENCOUNTER — Encounter: Payer: Self-pay | Admitting: Student

## 2022-01-14 ENCOUNTER — Emergency Department (HOSPITAL_BASED_OUTPATIENT_CLINIC_OR_DEPARTMENT_OTHER)
Admission: EM | Admit: 2022-01-14 | Discharge: 2022-01-15 | Disposition: A | Discharge status: Home / Self Care | Payer: 59 | Attending: Emergency Medicine | Admitting: Emergency Medicine

## 2022-01-14 ENCOUNTER — Other Ambulatory Visit: Payer: Self-pay

## 2022-01-14 ENCOUNTER — Encounter (HOSPITAL_BASED_OUTPATIENT_CLINIC_OR_DEPARTMENT_OTHER): Payer: Self-pay

## 2022-01-14 DIAGNOSIS — J020 Streptococcal pharyngitis: Secondary | ICD-10-CM | POA: Insufficient documentation

## 2022-01-14 DIAGNOSIS — Z20822 Contact with and (suspected) exposure to covid-19: Secondary | ICD-10-CM | POA: Insufficient documentation

## 2022-01-14 DIAGNOSIS — J029 Acute pharyngitis, unspecified: Secondary | ICD-10-CM | POA: Diagnosis present

## 2022-01-14 LAB — GROUP A STREP BY PCR: Group A Strep by PCR: DETECTED — AB

## 2022-01-14 MED ORDER — LIDOCAINE VISCOUS HCL 2 % MT SOLN
15.0000 mL | Freq: Once | OROMUCOSAL | Status: AC
  Administered 2022-01-14: 15 mL via OROMUCOSAL
  Filled 2022-01-14: qty 15

## 2022-01-14 MED ORDER — DEXAMETHASONE SODIUM PHOSPHATE 10 MG/ML IJ SOLN
8.0000 mg | Freq: Once | INTRAMUSCULAR | Status: AC
  Administered 2022-01-14: 8 mg via INTRAMUSCULAR
  Filled 2022-01-14: qty 1

## 2022-01-14 MED ORDER — ACETAMINOPHEN 325 MG PO TABS
650.0000 mg | ORAL_TABLET | Freq: Once | ORAL | Status: AC
  Administered 2022-01-14: 650 mg via ORAL
  Filled 2022-01-14: qty 2

## 2022-01-14 NOTE — ED Provider Notes (Signed)
?[facility] DEPT ?Provider Note ? ? ?CSN: [id number] ?Arrival date & time: 01/14/22  2121 ? ?  ? ?History ? ?Chief Complaint  ?Patient presents with  ? Sore Throat  ? ? ?ALAN RILES is a 46 y.o. female. ? ?HPI ?Patient is a 46 year old female who presents to the emergency department due to a sore throat that started 2 days ago.  Symptoms have been worsening since onset.  Reports worsening pain with swallowing.  Reports associated body aches as well as fevers with a Tmax of 101 ?F this afternoon.  She states that she has been taking Tylenol, Motrin, as well as TheraFlu with minimal relief.  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  Denies congestion, cough, nausea, vomiting, chest pain, shortness of breath. ?  ? ?Home Medications ?Prior to Admission medications   ?Medication Sig Start Date End Date Taking? Authorizing Provider  ?albuterol (VENTOLIN HFA) 108 (90 Base) MCG/ACT inhaler Inhale 2 puffs into the lungs every 6 (six) hours as needed for wheezing or shortness of breath. 08/29/16   Holley Raring, MD  ?naproxen (NAPROSYN) 375 MG tablet Take 1 tablet (375 mg total) by mouth 2 (two) times daily. 05/19/21   Palumbo, April, MD  ?traMADol (ULTRAM) 50 MG tablet Take 1 tablet (50 mg total) by mouth every 6 (six) hours as needed for severe pain. 05/19/21   Palumbo, April, MD  ?   ? ?Allergies    ?Patient has no known allergies.   ? ?Review of Systems   ?Review of Systems  ?Constitutional:  Positive for chills and fever.  ?HENT:  Positive for sore throat. Negative for congestion.   ?Respiratory:  Negative for cough and shortness of breath.   ?Cardiovascular:  Negative for chest pain.  ?Gastrointestinal:  Negative for nausea and vomiting.  ?Musculoskeletal:  Positive for myalgias.  ? ?Physical Exam ?Updated Vital Signs ?BP (!) 133/91 (BP Location: Right Arm)   Pulse 77   Temp 98.5 ?F (36.9 ?C) (Oral)   Resp 19   Ht '5\' 7"'$  (1.702 m)   Wt 74.8 kg   LMP 02/19/2020    SpO2 97%   BMI 25.83 kg/m?  ?Physical Exam ?Vitals and nursing note reviewed.  ?Constitutional:   ?   General: She is not in acute distress. ?   Appearance: Normal appearance. She is well-developed. She is not ill-appearing, toxic-appearing or diaphoretic.  ?HENT:  ?   Head: Normocephalic and atraumatic.  ?   Right Ear: External ear normal.  ?   Left Ear: External ear normal.  ?   Nose: Nose normal. No congestion or rhinorrhea.  ?   Mouth/Throat:  ?   Mouth: Mucous membranes are moist.  ?   Pharynx: Oropharyngeal exudate and posterior oropharyngeal erythema present. No uvula swelling.  ?   Tonsils: Tonsillar exudate present. No tonsillar abscesses. 2+ on the right. 2+ on the left.  ?   Comments: Uvula midline.  Moderate erythema noted in the posterior oropharynx as well as the bilateral tonsils with bilateral tonsillar exudates.  Moderate symmetric bilateral tonsillar hypertrophy.  Readily handling secretions.  No hot potato voice. ?Eyes:  ?   Extraocular Movements: Extraocular movements intact.  ?Cardiovascular:  ?   Rate and Rhythm: Normal rate and regular rhythm.  ?   Pulses: Normal pulses.  ?   Heart sounds: Normal heart sounds. No murmur heard. ?  No friction rub. No gallop.  ?Pulmonary:  ?   Effort: Pulmonary  effort is normal. No respiratory distress.  ?   Breath sounds: Normal breath sounds. No stridor. No wheezing, rhonchi or rales.  ?Abdominal:  ?   General: Abdomen is flat.  ?   Palpations: Abdomen is soft.  ?   Tenderness: There is no abdominal tenderness.  ?Musculoskeletal:     ?   General: Normal range of motion.  ?   Cervical back: Normal range of motion and neck supple. No tenderness.  ?Skin: ?   General: Skin is warm and dry.  ?Neurological:  ?   General: No focal deficit present.  ?   Mental Status: She is alert and oriented to person, place, and time.  ?Psychiatric:     ?   Mood and Affect: Mood normal.     ?   Behavior: Behavior normal.  ? ?ED Results / Procedures / Treatments   ?Labs ?(all  labs ordered are listed, but only abnormal results are displayed) ?Labs Reviewed  ?GROUP A STREP BY PCR - Abnormal; Notable for the following components:  ?    Result Value  ? Group A Strep by PCR DETECTED (*)   ? All other components within normal limits  ?RESP PANEL BY RT-PCR (FLU A&B, COVID) ARPGX2  ? ?EKG ?None ? ?Radiology ?No results found. ? ?Procedures ?Procedures  ? ?Medications Ordered in ED ?Medications  ?acetaminophen (TYLENOL) tablet 650 mg (650 mg Oral Given 01/14/22 2251)  ?lidocaine (XYLOCAINE) 2 % viscous mouth solution 15 mL (15 mLs Mouth/Throat Given 01/14/22 2254)  ?dexamethasone (DECADRON) injection 8 mg (8 mg Intramuscular Given 01/14/22 2252)  ?penicillin g benzathine (BICILLIN LA) 1200000 UNIT/2ML injection 1.2 Million Units (1.2 Million Units Intramuscular Given 01/15/22 0007)  ? ? ?ED Course/ Medical Decision Making/ A&P ?Clinical Course as of 01/15/22 2326  ?Tue Jan 15, 2022  ?0004 Group A Strep by PCR(!): DETECTED [LJ]  ?  ?Clinical Course User Index ?[LJ] Rayna Sexton, PA-C  ? ?                        ?Medical Decision Making ?Amount and/or Complexity of Data Reviewed ?Labs:  Decision-making details documented in ED Course. ? ?Risk ?OTC drugs. ?Prescription drug management. ? ?Pt is a 46 y.o. female who presents to the emergency department due to sore throat, fevers, body aches. ? ?Labs: ?Group A strep by PCR is positive. ?Respiratory panel is negative. ? ?I, Rayna Sexton, PA-C, personally reviewed and evaluated these images and lab results as part of my medical decision-making. ? ?On my exam uvula is midline.  Moderate erythema noted in the posterior oropharynx as well as the bilateral tonsils with bilateral tonsillar exudates.  Moderate symmetric bilateral tonsillar hypertrophy.  Readily handling secretions.  No hot potato voice.  Soft submental compartments.  Doubt Ludwig's angina or PTA at this time. ? ?Patient given IM Decadron, Tylenol, viscous lidocaine for symptoms.  Notes  moderate improvement.  She was found to be positive for strep pharyngitis.  She was given a dose of IM Bicillin. ? ?Patient appears stable for discharge at this time and she is agreeable.  Recommended continued use of Tylenol as well as Motrin.  Also recommended warm tea with honey as well as salt water gargles.  Discussed return precautions.  Her questions were answered and she was amicable at the time of discharge. ? ?Note: Portions of this report may have been transcribed using voice recognition software. Every effort was made to ensure accuracy; however, inadvertent computerized transcription  errors may be present.  ? ?Final Clinical Impression(s) / ED Diagnoses ?Final diagnoses:  ?Strep pharyngitis  ? ?Rx / DC Orders ?ED Discharge Orders   ? ? None  ? ?  ? ? ?  ?Rayna Sexton, PA-C ?01/15/22 2327 ? ?  ?Tegeler, Gwenyth Allegra, MD ?01/15/22 2338 ? ?

## 2022-01-14 NOTE — ED Triage Notes (Signed)
Patient here POV from Home.  ? ?Endorses Sore Throat since Saturday. Associated with Fever and Body Aches. ? ?No Congestion. No Cough. Fever of 101 at Home. ? ?NAD Noted during Triage. A&Ox4. GCS 15. Ambulatory. ?

## 2022-01-15 LAB — RESP PANEL BY RT-PCR (FLU A&B, COVID) ARPGX2
Influenza A by PCR: NEGATIVE
Influenza B by PCR: NEGATIVE
SARS Coronavirus 2 by RT PCR: NEGATIVE

## 2022-01-15 MED ORDER — PENICILLIN G BENZATHINE 1200000 UNIT/2ML IM SUSY
1.2000 10*6.[IU] | PREFILLED_SYRINGE | Freq: Once | INTRAMUSCULAR | Status: AC
  Administered 2022-01-15: 1.2 10*6.[IU] via INTRAMUSCULAR
  Filled 2022-01-15: qty 2

## 2022-01-15 NOTE — Discharge Instructions (Addendum)
Please continue to take Tylenol as well as Motrin for management of your pain.  Please follow the instructions on the bottles.  I would also recommend warm tea with honey as well as salt water gargles. ? ?Please continue to monitor your symptoms closely.  If you develop any new or worsening symptoms please come back to the emergency department. ?

## 2022-02-21 ENCOUNTER — Other Ambulatory Visit: Payer: Self-pay

## 2022-02-21 ENCOUNTER — Ambulatory Visit (INDEPENDENT_AMBULATORY_CARE_PROVIDER_SITE_OTHER): Payer: 59 | Admitting: Student

## 2022-02-21 ENCOUNTER — Encounter: Payer: Self-pay | Admitting: Student

## 2022-02-21 ENCOUNTER — Other Ambulatory Visit (HOSPITAL_COMMUNITY)
Admission: RE | Admit: 2022-02-21 | Discharge: 2022-02-21 | Disposition: A | Discharge status: Home / Self Care | Payer: 59 | Source: Ambulatory Visit | Attending: Internal Medicine | Admitting: Internal Medicine

## 2022-02-21 VITALS — BP 140/99 | HR 64 | Temp 98.2°F | Ht 67.0 in | Wt 162.9 lb

## 2022-02-21 DIAGNOSIS — N760 Acute vaginitis: Secondary | ICD-10-CM

## 2022-02-21 DIAGNOSIS — B9689 Other specified bacterial agents as the cause of diseases classified elsewhere: Secondary | ICD-10-CM | POA: Diagnosis not present

## 2022-02-21 DIAGNOSIS — R5383 Other fatigue: Secondary | ICD-10-CM | POA: Diagnosis not present

## 2022-02-21 DIAGNOSIS — G4700 Insomnia, unspecified: Secondary | ICD-10-CM | POA: Diagnosis not present

## 2022-02-21 DIAGNOSIS — J4520 Mild intermittent asthma, uncomplicated: Secondary | ICD-10-CM

## 2022-02-21 DIAGNOSIS — N939 Abnormal uterine and vaginal bleeding, unspecified: Secondary | ICD-10-CM

## 2022-02-21 DIAGNOSIS — Z206 Contact with and (suspected) exposure to human immunodeficiency virus [HIV]: Secondary | ICD-10-CM

## 2022-02-21 DIAGNOSIS — R030 Elevated blood-pressure reading, without diagnosis of hypertension: Secondary | ICD-10-CM | POA: Diagnosis not present

## 2022-02-21 DIAGNOSIS — D509 Iron deficiency anemia, unspecified: Secondary | ICD-10-CM

## 2022-02-21 DIAGNOSIS — Z719 Counseling, unspecified: Secondary | ICD-10-CM

## 2022-02-21 DIAGNOSIS — R739 Hyperglycemia, unspecified: Secondary | ICD-10-CM

## 2022-02-21 DIAGNOSIS — Z0000 Encounter for general adult medical examination without abnormal findings: Secondary | ICD-10-CM

## 2022-02-21 DIAGNOSIS — E782 Mixed hyperlipidemia: Secondary | ICD-10-CM | POA: Diagnosis not present

## 2022-02-21 DIAGNOSIS — R69 Illness, unspecified: Secondary | ICD-10-CM | POA: Diagnosis not present

## 2022-02-21 LAB — GLUCOSE, CAPILLARY: Glucose-Capillary: 95 mg/dL (ref 70–99)

## 2022-02-21 LAB — POCT GLYCOSYLATED HEMOGLOBIN (HGB A1C): Hemoglobin A1C: 5.1 % (ref 4.0–5.6)

## 2022-02-21 MED ORDER — ALBUTEROL SULFATE HFA 108 (90 BASE) MCG/ACT IN AERS: 2.0000 | INHALATION_SPRAY | Freq: Four times a day (QID) | RESPIRATORY_TRACT | 3 refills | Status: DC | PRN

## 2022-02-21 NOTE — Progress Notes (Signed)
   CC: Follow up/lab work  HPI:  [name] is a 46 y.o. female with PMH as below who presented to clinic for follow-up on her chronic medical problems and for medication refill. Please see problem based charting for evaluation, assessment and plan.  Past Medical History:  Diagnosis Date   Anemia    Anxiety    Asthma    With Exacerbations   Constipation    SP normal colonoscopy 7/08, GI lebaur Dr. Elicia Lamp.   Displacement of intrauterine contraceptive device 06/07/2019   GERD (gastroesophageal reflux disease)    pt. denies   Hepatitis B immune    Palpitations    Chronic    Review of Systems:  Constitutional: Negative for fever or weight changes. Positive for fatigue Eyes: Negative for visual changes Respiratory: Negative for shortness of breath MSK: Negative for back pain GU: Negative for dysuria or vaginal discharge. Positive for occasional vaginal itching. Neuro: Negative for headache or weakness  Physical Exam: General: Pleasant, well-appearing middle-age woman. No acute distress. Cardiac: RRR. No murmurs, rubs or gallops. No LE edema Respiratory: Lungs CTAB. No wheezing or crackles. Abdominal: Soft, symmetric and non tender. Normal BS. Skin: Warm, dry and intact without rashes or lesions Extremities: Atraumatic. Full ROM. Palpable radial and DP pulses. Neuro: A&O x 3. Moves all extremities. Normal sensation to gross touch Psych: Appropriate mood and affect.  Vitals:   02/21/22 1404 02/21/22 1427  BP: (!) 152/109 (!) 140/99  Pulse: 66 64  Temp: 98.2 F (36.8 C)   TempSrc: Oral   SpO2: 100%   Weight: 162 lb 14.4 oz (73.9 kg)   Height: '5\' 7"'$  (1.702 m)     Assessment & Plan:   See Encounters Tab for problem based charting.  Patient discussed with Dr. Emelia Salisbury, MD, MPH

## 2022-02-21 NOTE — Patient Instructions (Addendum)
Thank you, [name] for allowing [place] to provide your care today. Today,  we discussed your fatigue, asthma, blood pressure and STI testing.  I am getting some lab work to evaluate for your fatigue. Since you have had a hysterectomy, you do not need a Pap smear.  I have ordered the following labs for you:   Lab Orders         CMP14 + Anion Gap         Lipid Profile         Iron and IBC ([id number])         CBC no Diff         Ferritin         TSH         HIV antibody (with reflex)         RPR         POC Hbg A1C      I will call if any are abnormal. All of your labs can be accessed through "My Chart".  I have place a referrals for mammography  Follow-up is 6 to 12 months  My Chart Access: [email or web address]   Please make sure to arrive 15 minutes prior to your next appointment. If you arrive late, you may be asked to reschedule.    We look forward to seeing you next time. Please call our clinic at [phone] if you have any questions or concerns. The best time to call is Monday-Friday from 9am-4pm, but there is someone available 24/7. If after hours or the weekend, call the main hospital number and ask for the Internal Medicine Resident On-Call. If you need medication refills, please notify your pharmacy one week in advance and they will send [place] a request.   Thank you for letting us take part in your care. Wishing you the best!  Lacinda Axon, MD 02/21/2022, 3:19 PM IM Resident, PGY-2 [facility] 41:10

## 2022-02-22 LAB — CMP14 + ANION GAP
ALT: 19 IU/L (ref 0–32)
AST: 15 IU/L (ref 0–40)
Albumin/Globulin Ratio: 1.8 (ref 1.2–2.2)
Albumin: 4.6 g/dL (ref 3.8–4.8)
Alkaline Phosphatase: 99 IU/L (ref 44–121)
Anion Gap: 15 mmol/L (ref 10.0–18.0)
BUN/Creatinine Ratio: 17 (ref 9–23)
BUN: 11 mg/dL (ref 6–24)
Bilirubin Total: 0.3 mg/dL (ref 0.0–1.2)
CO2: 24 mmol/L (ref 20–29)
Calcium: 9.8 mg/dL (ref 8.7–10.2)
Chloride: 103 mmol/L (ref 96–106)
Creatinine, Ser: 0.65 mg/dL (ref 0.57–1.00)
Globulin, Total: 2.5 g/dL (ref 1.5–4.5)
Glucose: 94 mg/dL (ref 70–99)
Potassium: 5.2 mmol/L (ref 3.5–5.2)
Sodium: 142 mmol/L (ref 134–144)
Total Protein: 7.1 g/dL (ref 6.0–8.5)
eGFR: 109 mL/min/{1.73_m2} (ref >59)

## 2022-02-22 LAB — CERVICOVAGINAL ANCILLARY ONLY
Bacterial Vaginitis (gardnerella): POSITIVE — AB
Chlamydia: NEGATIVE
Comment: NEGATIVE
Comment: NEGATIVE
Comment: NEGATIVE
Comment: NORMAL
Neisseria Gonorrhea: NEGATIVE
Trichomonas: NEGATIVE

## 2022-02-22 LAB — CBC
Hematocrit: 40.4 % (ref 34.0–46.6)
Hemoglobin: 14.3 g/dL (ref 11.1–15.9)
MCH: 33.2 pg — ABNORMAL HIGH (ref 26.6–33.0)
MCHC: 35.4 g/dL (ref 31.5–35.7)
MCV: 94 fL (ref 79–97)
Platelets: 385 10*3/uL (ref 150–450)
RBC: 4.31 x10E6/uL (ref 3.77–5.28)
RDW: 11.9 % (ref 11.7–15.4)
WBC: 6.2 10*3/uL (ref 3.4–10.8)

## 2022-02-22 LAB — IRON AND TIBC
Iron Saturation: 19 % (ref 15–55)
Iron: 75 ug/dL (ref 27–159)
Total Iron Binding Capacity: 393 ug/dL (ref 250–450)
UIBC: 318 ug/dL (ref 131–425)

## 2022-02-22 LAB — LIPID PANEL
Chol/HDL Ratio: 3.6 ratio (ref 0.0–4.4)
Cholesterol, Total: 196 mg/dL (ref 100–199)
HDL: 55 mg/dL (ref >39)
LDL Chol Calc (NIH): 123 mg/dL — ABNORMAL HIGH (ref 0–99)
Triglycerides: 102 mg/dL (ref 0–149)
VLDL Cholesterol Cal: 18 mg/dL (ref 5–40)

## 2022-02-22 LAB — FERRITIN: Ferritin: 83 ng/mL (ref 15–150)

## 2022-02-22 LAB — HIV ANTIBODY (ROUTINE TESTING W REFLEX): HIV Screen 4th Generation wRfx: NONREACTIVE

## 2022-02-22 LAB — RPR: RPR Ser Ql: NONREACTIVE

## 2022-02-22 LAB — TSH: TSH: 1.45 u[IU]/mL (ref 0.450–4.500)

## 2022-02-24 ENCOUNTER — Encounter: Payer: Self-pay | Admitting: Student

## 2022-02-24 DIAGNOSIS — R030 Elevated blood-pressure reading, without diagnosis of hypertension: Secondary | ICD-10-CM | POA: Insufficient documentation

## 2022-02-24 MED ORDER — METRONIDAZOLE 500 MG PO TABS: 500.0000 mg | ORAL_TABLET | Freq: Two times a day (BID) | ORAL | 0 refills | Status: AC

## 2022-02-24 MED ORDER — HYDROXYZINE HCL 25 MG PO TABS: 25.0000 mg | ORAL_TABLET | Freq: Every day | ORAL | 5 refills | Status: AC

## 2022-02-24 NOTE — Assessment & Plan Note (Addendum)
Patient denies any new sex partners.  Patient reports occasional vaginal itching but denies any dysuria or vaginal discharge. HIV testing negative today. Labs also showed negative RPR. Cervicovaginal testing negative for Chlamydia, Gonorrhea and trichomonas but positive for BV.   Patient with no mammogram on file since 2019.  --Ordered screening mammogram

## 2022-02-24 NOTE — Assessment & Plan Note (Signed)
Patient found to have blood pressure elevation to 152/109 with some improvement to 140/99 on repeat vitals. Patient reports that her blood pressure has been normal when they check it at work with a manual blood pressure cuff. 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 Patient advised to work on lifestyle modifications with dietary changes and exercise. Also advised to check blood pressure at home and bring readings to next office visit. -BP check at next office visit

## 2022-02-24 NOTE — Assessment & Plan Note (Addendum)
Patient denies any new sex partners. Denies any genitourinary symptoms such as dysuria, vaginal itching or discharge. HIV testing negative today. Labs also showed negative RPR. Cervicovaginal testing negative for Chlamydia, Gonorrhea and trichomonas.

## 2022-02-24 NOTE — Assessment & Plan Note (Signed)
Lipid panel today showed LDL of 123.  Patient has a ASCVD score is low at approximately 2%.  No statin indicated at this time but lifestyle modification recommended to patient. -Repeat lipid panel in 1 year

## 2022-02-24 NOTE — Assessment & Plan Note (Addendum)
Patient with a history of bacterial vaginosis found to be positive for BV again today.  Patient reports some mild vaginal itching recently but no vaginal discharge, vaginal bleeding or pain with intercourse.  Patient tested negative for HIV, RPR, chlamydia/gonorrhea and trichomonas.   Plan: -Metronidazole 500 mg twice daily for 7 days -Follow-up in 6 months or as needed

## 2022-02-24 NOTE — Assessment & Plan Note (Signed)
Patient with a history of iron deficiency anemia secondary to abnormal uterine bleeding last year.  She reports ongoing fatigue that has slightly improved since last year.  Patient reports she had a hysterectomy in June 2022 and has not had any more bleeding since then. CBC today shows normal hemoglobin at 14.3. Iron studies within normal limits.

## 2022-02-24 NOTE — Assessment & Plan Note (Addendum)
Patient reports ongoing fatigue that is slightly better compared to last year. States he is mostly tired because she does not get good sleep. She reports waking up multiple times in the middle of the night. She has tried melatonin in the past and it did not work for her. She is hesitant about trying other sleep aids but willing to try hydroxyzine. Patient does report snoring at night but her stop bang score of 2 shows a low risk of moderate to severe OSA. TSH and iron studies within normal limits. CBC and CMP unremarkable.  Plan: -Start hydroxyzine 25 mg at bedtime -Sleep hygiene education -Follow-up as needed

## 2022-02-24 NOTE — Assessment & Plan Note (Signed)
Well-controlled.  Patient denies any cough or wheezing.  States she is out of her albuterol inhaler. -Refill prn albuterol inhaler

## 2022-02-25 NOTE — Progress Notes (Signed)
Internal Medicine Clinic Attending  Case discussed with Dr. Amponsah  At the time of the visit.  We reviewed the resident's history and exam and pertinent patient test results.  I agree with the assessment, diagnosis, and plan of care documented in the resident's note.  

## 2022-02-28 ENCOUNTER — Ambulatory Visit
Admission: RE | Admit: 2022-02-28 | Discharge: 2022-02-28 | Disposition: A | Discharge status: Home / Self Care | Payer: 59 | Source: Ambulatory Visit | Attending: Internal Medicine | Admitting: Internal Medicine

## 2022-02-28 DIAGNOSIS — Z1231 Encounter for screening mammogram for malignant neoplasm of breast: Secondary | ICD-10-CM | POA: Diagnosis not present

## 2022-02-28 DIAGNOSIS — Z0000 Encounter for general adult medical examination without abnormal findings: Secondary | ICD-10-CM

## 2022-03-01 ENCOUNTER — Other Ambulatory Visit: Payer: Self-pay | Admitting: Internal Medicine

## 2022-03-01 DIAGNOSIS — R928 Other abnormal and inconclusive findings on diagnostic imaging of breast: Secondary | ICD-10-CM

## 2022-03-07 ENCOUNTER — Ambulatory Visit
Admission: RE | Admit: 2022-03-07 | Discharge: 2022-03-07 | Disposition: A | Discharge status: Home / Self Care | Payer: 59 | Source: Ambulatory Visit | Attending: Internal Medicine | Admitting: Internal Medicine

## 2022-03-07 ENCOUNTER — Other Ambulatory Visit: Payer: Self-pay | Admitting: Internal Medicine

## 2022-03-07 DIAGNOSIS — N6001 Solitary cyst of right breast: Secondary | ICD-10-CM

## 2022-03-07 DIAGNOSIS — N6011 Diffuse cystic mastopathy of right breast: Secondary | ICD-10-CM | POA: Diagnosis not present

## 2022-03-07 DIAGNOSIS — R928 Other abnormal and inconclusive findings on diagnostic imaging of breast: Secondary | ICD-10-CM | POA: Diagnosis not present

## 2022-03-08 NOTE — Progress Notes (Signed)
You are welcome. She is nice. I spoke with her about the results and she already have repeat US schedule in 6 months for surveillance.

## 2022-05-09 ENCOUNTER — Encounter: Payer: Self-pay | Admitting: Plastic Surgery

## 2022-06-21 NOTE — Telephone Encounter (Signed)
Pt was called and she will call us back once she is available to make the appointment.

## 2022-07-16 ENCOUNTER — Encounter: Payer: 59 | Admitting: Plastic Surgery

## 2022-07-31 ENCOUNTER — Encounter: Payer: Self-pay | Admitting: Gastroenterology

## 2022-08-10 DIAGNOSIS — Z720 Tobacco use: Secondary | ICD-10-CM | POA: Diagnosis not present

## 2022-08-10 DIAGNOSIS — J45909 Unspecified asthma, uncomplicated: Secondary | ICD-10-CM | POA: Diagnosis not present

## 2022-09-10 ENCOUNTER — Other Ambulatory Visit: Payer: Self-pay | Admitting: Internal Medicine

## 2022-09-10 ENCOUNTER — Ambulatory Visit
Admission: RE | Admit: 2022-09-10 | Discharge: 2022-09-10 | Disposition: A | Discharge status: Home / Self Care | Payer: 59 | Source: Ambulatory Visit | Attending: Internal Medicine | Admitting: Internal Medicine

## 2022-09-10 DIAGNOSIS — N6001 Solitary cyst of right breast: Secondary | ICD-10-CM

## 2022-09-10 DIAGNOSIS — N6313 Unspecified lump in the right breast, lower outer quadrant: Secondary | ICD-10-CM | POA: Diagnosis not present

## 2022-09-10 DIAGNOSIS — N6311 Unspecified lump in the right breast, upper outer quadrant: Secondary | ICD-10-CM | POA: Diagnosis not present

## 2023-02-25 ENCOUNTER — Encounter: Payer: Self-pay | Admitting: *Deleted

## 2023-03-13 ENCOUNTER — Other Ambulatory Visit: Payer: Self-pay | Admitting: Student

## 2023-03-13 DIAGNOSIS — J4520 Mild intermittent asthma, uncomplicated: Secondary | ICD-10-CM

## 2023-03-14 ENCOUNTER — Other Ambulatory Visit: Payer: 59

## 2023-04-27 IMAGING — MG MM DIGITAL SCREENING BILAT W/ TOMO AND CAD
6 of 10 series · 6 of 30 positions shown · non-contrast
Comparison: Previous exam(s).

CLINICAL DATA: Screening.

EXAM:
DIGITAL SCREENING BILATERAL MAMMOGRAM WITH TOMOSYNTHESIS AND CAD
TECHNIQUE: Bilateral screening digital craniocaudal and mediolateral oblique
mammograms were obtained. Bilateral screening digital breast
tomosynthesis was performed. The images were evaluated with
computer-aided detection.

[R MLO synth-2D]
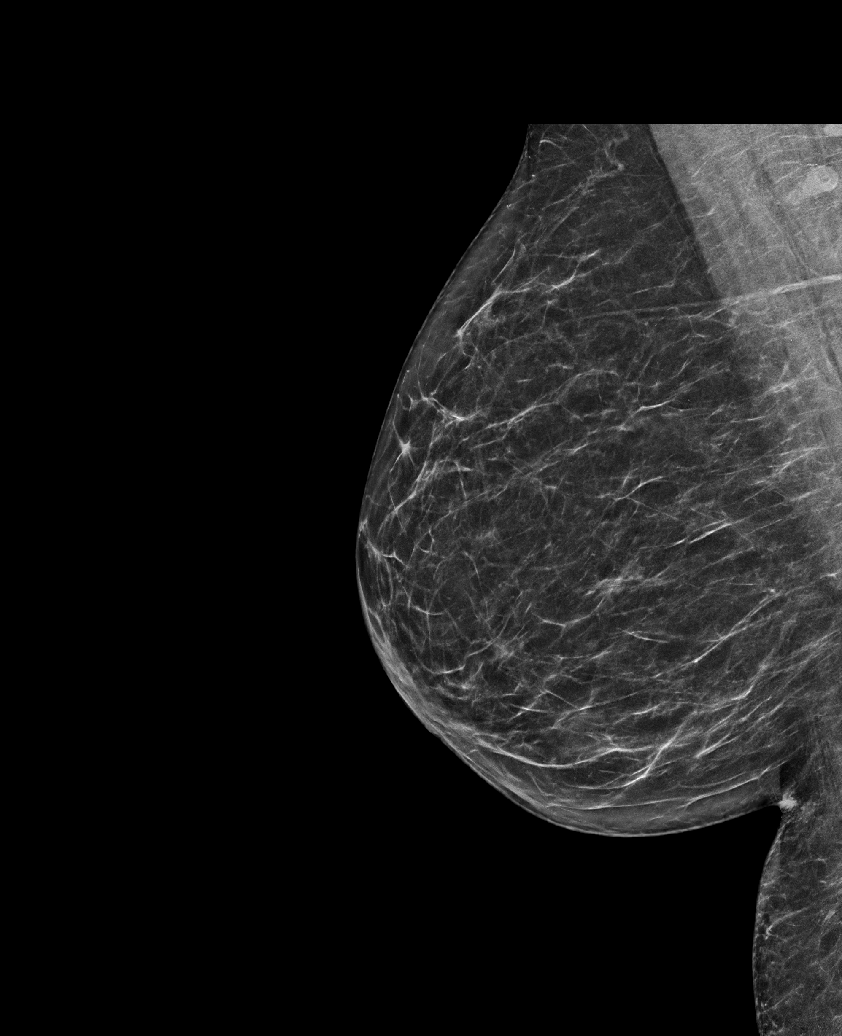

[L MLO synth-2D]
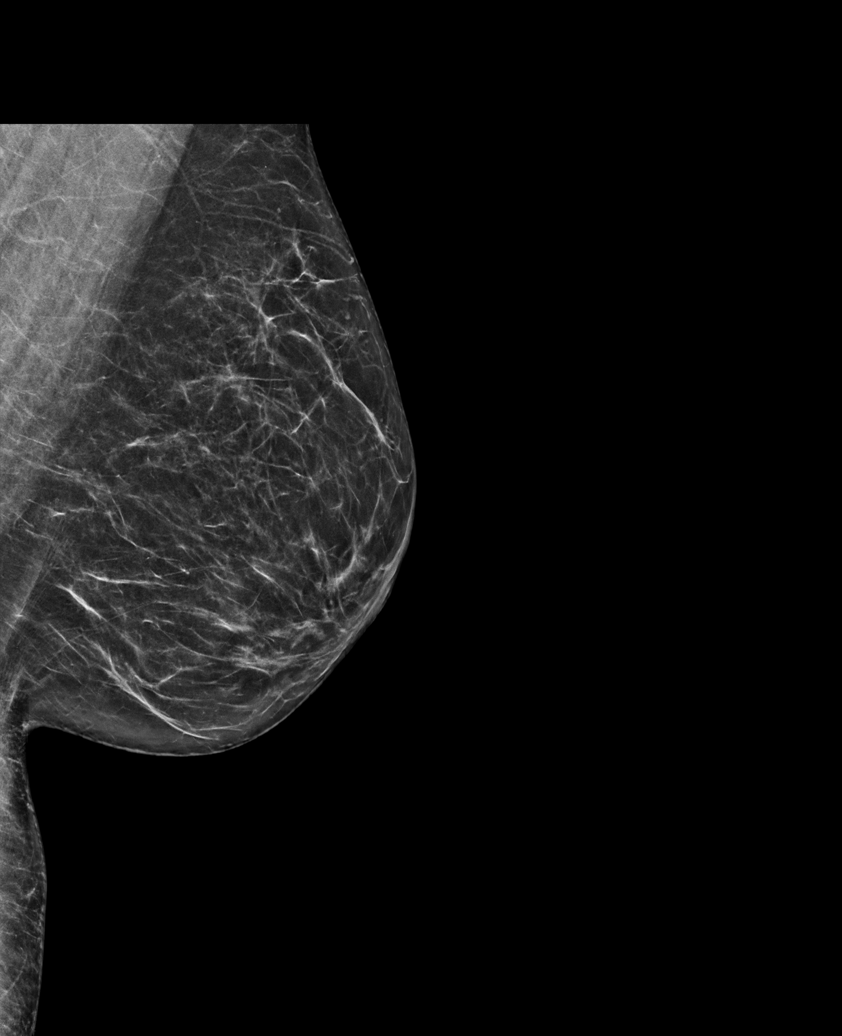

[R CC synth-2D (1 of 2)]
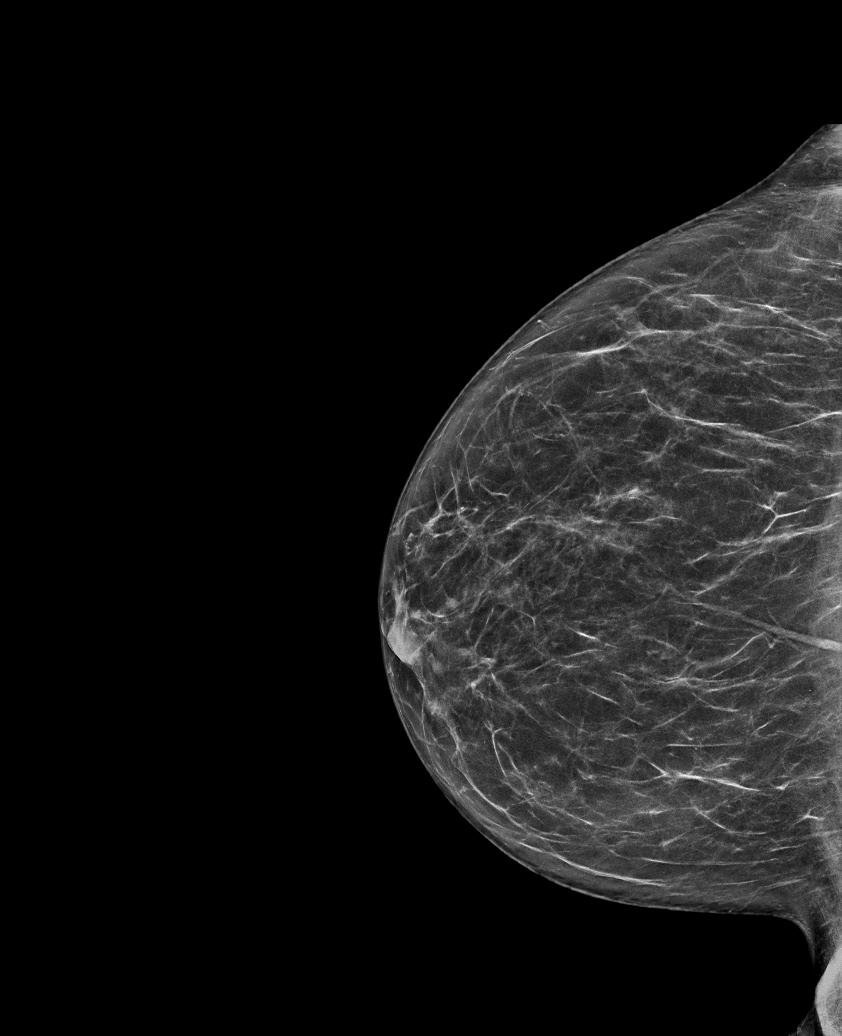

[L CC synth-2D]
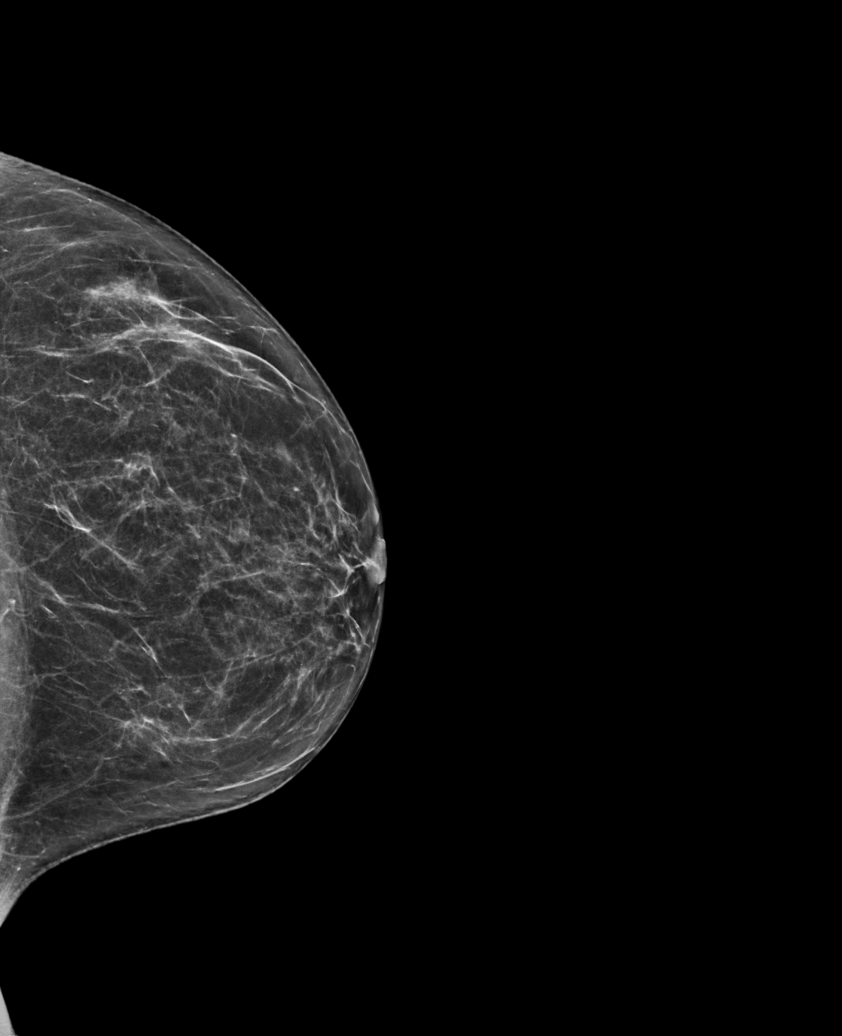

[R CC synth-2D (2 of 2)]
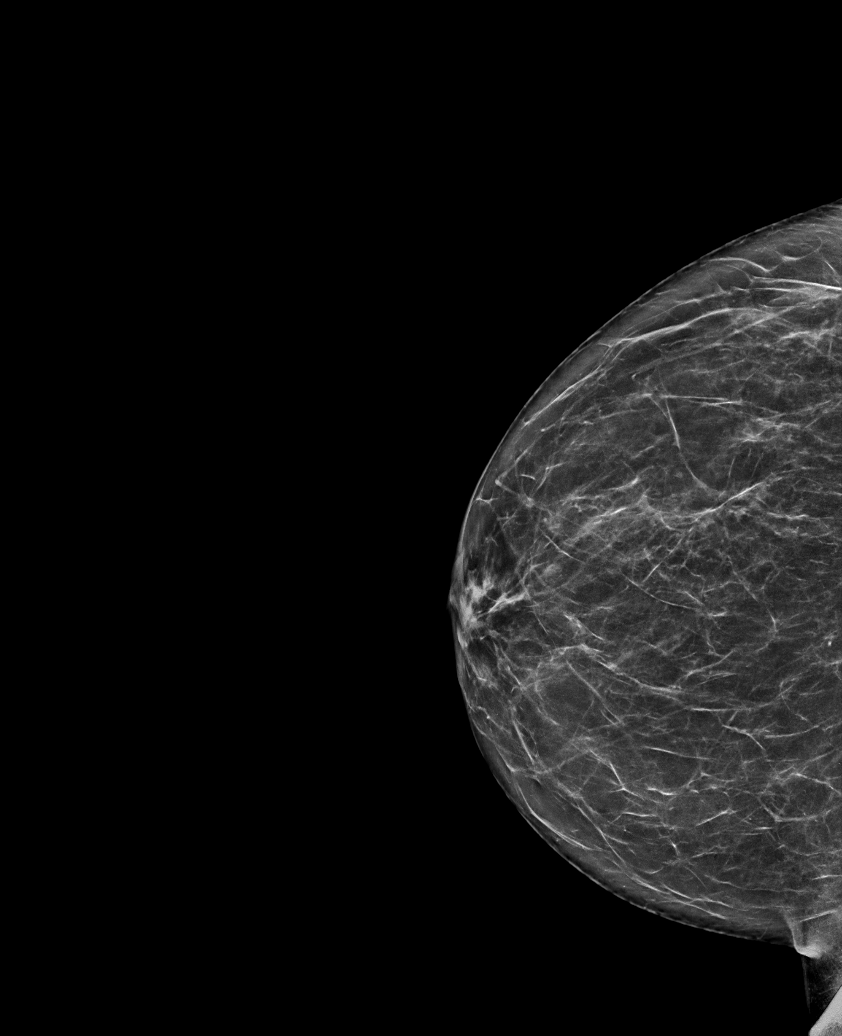

[L MLO tomo · tomo slice 35/69.0]
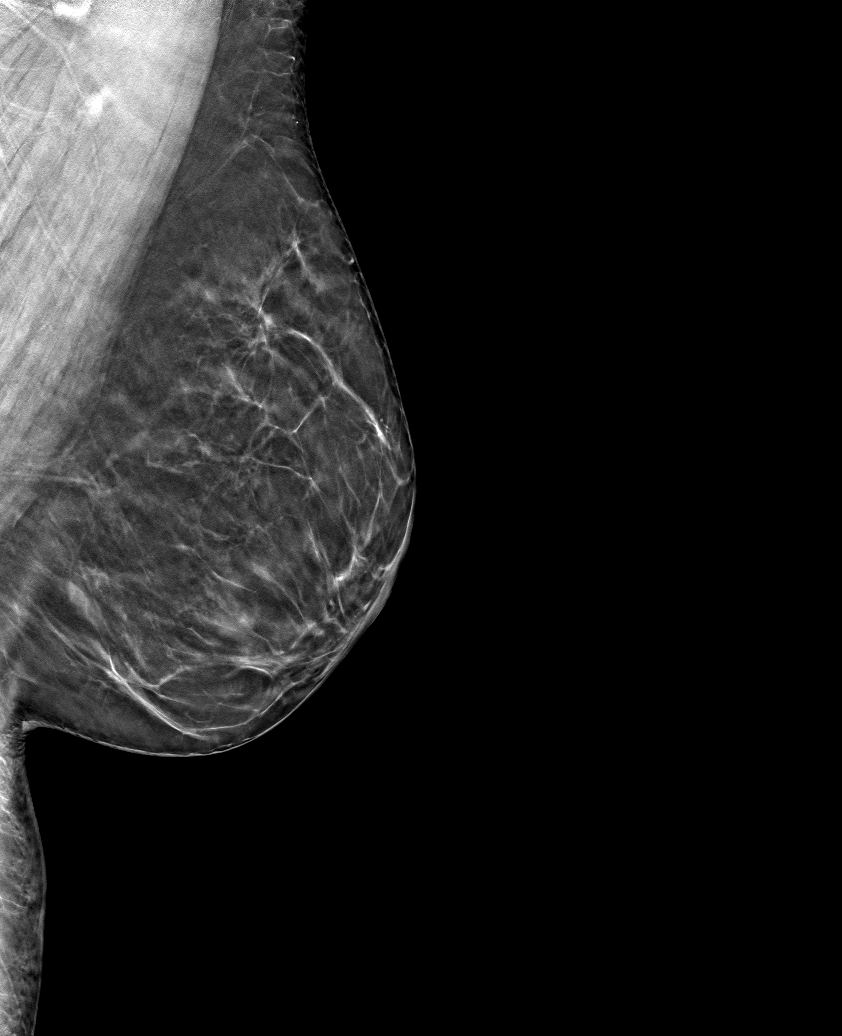

[6 of 30 positions shown; findings below may reference images not displayed]

ACR Breast Density Category b: There are scattered areas of
fibroglandular density.
FINDINGS: In the right breast, a possible focal asymmetry warrants further
evaluation. In the left breast, no findings suspicious for
malignancy.
IMPRESSION: Further evaluation is suggested for possible focal asymmetry in the
right breast.

RECOMMENDATION:
Diagnostic mammogram and possibly ultrasound of the right breast.
(Code:[id number])

The patient will be contacted regarding the findings, and additional
imaging will be scheduled.

BI-RADS CATEGORY  0: Incomplete. Need additional imaging evaluation
and/or prior mammograms for comparison.
